# Patient Record
Sex: Female | Born: 1935 | Race: Black or African American | Hispanic: No | Smoking: Current every day smoker
Health system: Southern US, Community
[De-identification: ages and names within clinical notes are randomized; demographics above are authoritative.]

## PROBLEM LIST (undated history)

## (undated) DIAGNOSIS — N289 Disorder of kidney and ureter, unspecified: Secondary | ICD-10-CM

## (undated) DIAGNOSIS — F419 Anxiety disorder, unspecified: Secondary | ICD-10-CM

## (undated) DIAGNOSIS — I10 Essential (primary) hypertension: Secondary | ICD-10-CM

## (undated) HISTORY — PX: ABDOMINAL HYSTERECTOMY: SHX81

---

## 1998-02-21 ENCOUNTER — Ambulatory Visit (HOSPITAL_COMMUNITY): Admission: RE | Admit: 1998-02-21 | Discharge: 1998-02-21 | Payer: Self-pay | Admitting: Neurological Surgery

## 1998-10-31 ENCOUNTER — Ambulatory Visit (HOSPITAL_BASED_OUTPATIENT_CLINIC_OR_DEPARTMENT_OTHER): Admission: RE | Admit: 1998-10-31 | Discharge: 1998-10-31 | Payer: Self-pay | Admitting: Orthopedic Surgery

## 1999-11-05 ENCOUNTER — Encounter: Admission: RE | Admit: 1999-11-05 | Discharge: 1999-11-05 | Payer: Self-pay | Admitting: Family Medicine

## 1999-11-05 ENCOUNTER — Encounter: Payer: Self-pay | Admitting: Family Medicine

## 2002-01-05 ENCOUNTER — Encounter: Payer: Self-pay | Admitting: Family Medicine

## 2002-01-05 ENCOUNTER — Encounter: Admission: RE | Admit: 2002-01-05 | Discharge: 2002-01-05 | Payer: Self-pay | Admitting: Family Medicine

## 2002-04-13 ENCOUNTER — Encounter: Payer: Self-pay | Admitting: Family Medicine

## 2002-04-13 ENCOUNTER — Encounter: Admission: RE | Admit: 2002-04-13 | Discharge: 2002-04-13 | Payer: Self-pay | Admitting: Family Medicine

## 2002-04-14 ENCOUNTER — Encounter: Payer: Self-pay | Admitting: Family Medicine

## 2002-04-14 ENCOUNTER — Encounter: Admission: RE | Admit: 2002-04-14 | Discharge: 2002-04-14 | Payer: Self-pay | Admitting: Family Medicine

## 2002-04-29 ENCOUNTER — Encounter: Admission: RE | Admit: 2002-04-29 | Discharge: 2002-04-29 | Payer: Self-pay | Admitting: Family Medicine

## 2002-04-29 ENCOUNTER — Encounter: Payer: Self-pay | Admitting: Family Medicine

## 2002-06-23 ENCOUNTER — Encounter: Payer: Self-pay | Admitting: Family Medicine

## 2002-06-23 ENCOUNTER — Encounter: Admission: RE | Admit: 2002-06-23 | Discharge: 2002-06-23 | Payer: Self-pay | Admitting: Family Medicine

## 2002-08-01 ENCOUNTER — Encounter: Payer: Self-pay | Admitting: Family Medicine

## 2002-08-01 ENCOUNTER — Encounter: Admission: RE | Admit: 2002-08-01 | Discharge: 2002-08-01 | Payer: Self-pay | Admitting: Family Medicine

## 2002-08-03 ENCOUNTER — Encounter: Payer: Self-pay | Admitting: Family Medicine

## 2002-08-03 ENCOUNTER — Encounter: Admission: RE | Admit: 2002-08-03 | Discharge: 2002-08-03 | Payer: Self-pay | Admitting: Family Medicine

## 2003-10-05 ENCOUNTER — Encounter: Admission: RE | Admit: 2003-10-05 | Discharge: 2003-10-05 | Payer: Self-pay | Admitting: Family Medicine

## 2004-07-03 ENCOUNTER — Encounter: Admission: RE | Admit: 2004-07-03 | Discharge: 2004-07-03 | Payer: Self-pay | Admitting: Family Medicine

## 2004-12-15 ENCOUNTER — Encounter: Admission: RE | Admit: 2004-12-15 | Discharge: 2004-12-15 | Payer: Self-pay | Admitting: Family Medicine

## 2005-02-06 ENCOUNTER — Ambulatory Visit (HOSPITAL_COMMUNITY): Admission: RE | Admit: 2005-02-06 | Discharge: 2005-02-06 | Payer: Self-pay | Admitting: Gastroenterology

## 2005-03-03 ENCOUNTER — Encounter: Admission: RE | Admit: 2005-03-03 | Discharge: 2005-03-03 | Payer: Self-pay | Admitting: Family Medicine

## 2005-09-05 ENCOUNTER — Ambulatory Visit (HOSPITAL_COMMUNITY): Admission: RE | Admit: 2005-09-05 | Discharge: 2005-09-05 | Payer: Self-pay | Admitting: Family Medicine

## 2007-08-04 ENCOUNTER — Encounter: Admission: RE | Admit: 2007-08-04 | Discharge: 2007-08-04 | Payer: Self-pay | Admitting: Family Medicine

## 2008-03-13 ENCOUNTER — Emergency Department (HOSPITAL_COMMUNITY): Admission: EM | Admit: 2008-03-13 | Discharge: 2008-03-13 | Payer: Self-pay | Admitting: Emergency Medicine

## 2008-04-05 ENCOUNTER — Encounter: Admission: RE | Admit: 2008-04-05 | Discharge: 2008-04-05 | Payer: Self-pay | Admitting: Gastroenterology

## 2009-04-08 ENCOUNTER — Inpatient Hospital Stay (HOSPITAL_COMMUNITY): Admission: EM | Admit: 2009-04-08 | Discharge: 2009-04-11 | Payer: Self-pay | Admitting: Emergency Medicine

## 2011-01-27 LAB — BASIC METABOLIC PANEL
BUN: 10 mg/dL (ref 6–23)
CO2: 25 mEq/L (ref 19–32)
CO2: 25 mEq/L (ref 19–32)
CO2: 26 mEq/L (ref 19–32)
Calcium: 8.3 mg/dL — ABNORMAL LOW (ref 8.4–10.5)
Calcium: 8.7 mg/dL (ref 8.4–10.5)
Calcium: 8.7 mg/dL (ref 8.4–10.5)
Chloride: 115 mEq/L — ABNORMAL HIGH (ref 96–112)
Creatinine, Ser: 1.04 mg/dL (ref 0.4–1.2)
GFR calc Af Amer: 58 mL/min — ABNORMAL LOW (ref >60)
GFR calc non Af Amer: 40 mL/min — ABNORMAL LOW (ref >60)
GFR calc non Af Amer: 52 mL/min — ABNORMAL LOW (ref >60)
Glucose, Bld: 110 mg/dL — ABNORMAL HIGH (ref 70–99)
Glucose, Bld: 114 mg/dL — ABNORMAL HIGH (ref 70–99)
Glucose, Bld: 151 mg/dL — ABNORMAL HIGH (ref 70–99)
Potassium: 3.8 mEq/L (ref 3.5–5.1)
Potassium: 3.9 mEq/L (ref 3.5–5.1)
Potassium: 4 mEq/L (ref 3.5–5.1)
Sodium: 142 mEq/L (ref 135–145)

## 2011-01-27 LAB — CBC
HCT: 26.1 % — ABNORMAL LOW (ref 36.0–46.0)
HCT: 30.7 % — ABNORMAL LOW (ref 36.0–46.0)
HCT: 35.2 % — ABNORMAL LOW (ref 36.0–46.0)
Hemoglobin: 10.4 g/dL — ABNORMAL LOW (ref 12.0–15.0)
MCHC: 34.1 g/dL (ref 30.0–36.0)
MCV: 88.8 fL (ref 78.0–100.0)
MCV: 88.8 fL (ref 78.0–100.0)
Platelets: 131 10*3/uL — ABNORMAL LOW (ref 150–400)
Platelets: 138 10*3/uL — ABNORMAL LOW (ref 150–400)
RBC: 2.9 MIL/uL — ABNORMAL LOW (ref 3.87–5.11)
WBC: 9.3 10*3/uL (ref 4.0–10.5)

## 2011-01-27 LAB — URINALYSIS, ROUTINE W REFLEX MICROSCOPIC
Bilirubin Urine: NEGATIVE
Ketones, ur: NEGATIVE mg/dL
Specific Gravity, Urine: 1.013 (ref 1.005–1.030)
Urobilinogen, UA: 1 mg/dL (ref 0.0–1.0)

## 2011-01-27 LAB — ABO/RH: ABO/RH(D): O POS

## 2011-01-27 LAB — DIFFERENTIAL
Basophils Relative: 0 % (ref 0–1)
Eosinophils Relative: 4 % (ref 0–5)
Monocytes Absolute: 0.6 10*3/uL (ref 0.1–1.0)
Monocytes Relative: 9 % (ref 3–12)
Neutrophils Relative %: 49 % (ref 43–77)

## 2011-01-27 LAB — URINE MICROSCOPIC-ADD ON

## 2011-01-27 LAB — PROTIME-INR: INR: 1 (ref 0.00–1.49)

## 2011-01-27 LAB — TYPE AND SCREEN

## 2011-03-04 NOTE — Op Note (Signed)
[name], [name] NO.:  [id number]   MEDICAL RECORD NO.:  [id number]          PATIENT TYPE:  INP   LOCATION:  1602                         FACILITY:  [facility]   PHYSICIAN:  Madlyn Frankel. Charlann Boxer, M.D.  DATE OF BIRTH:  [birth year]   DATE OF PROCEDURE:  04/09/2009  DATE OF DISCHARGE:                               OPERATIVE REPORT   PREOPERATIVE DIAGNOSIS:  Right intertrochanteric femur fracture.   POSTOPERATIVE DIAGNOSIS:  Right intertrochanteric femur fracture.   PROCEDURE:  Open reduction internal fixation, right intertrochanteric  femur fracture utilizing DePuy trochanteric nail size 11 x 200 mm with  130-degree lag screw measuring 110 mm.   SURGEON:  Madlyn Frankel. Charlann Boxer, M.D.   ASSISTANT:  Surgical tech.   ANESTHESIA:  General.   BLOOD LOSS:  Less than 150 mL.   DRAINS:  None.   SPECIMEN:  None.   COMPLICATIONS:  None.   INDICATIONS FOR PROCEDURE:  Ms. Giuffre is an 77 year old female who  resides in [place] with her daughter.  She fell at home and was brought  to the emergency room.  Radiographs revealed that she had a right  intertrochanteric fracture.  We were consulted and she was admitted to  the hospital.  She is relatively healthy.  We stabilized her overnight  with preparation for surgery the next day.  The risks and benefits and  planned procedure were discussed with the family.  Consent was obtained  for the benefit of pain relief and fracture healing.   PROCEDURE IN DETAIL:  The patient was brought to the operative theater.  Once adequate anesthesia and preoperative antibiotics, Ancef, were  administered, the patient was positioned supine and the left leg was  flexed and abducted out of the way.  Then the right leg was placed in a  traction shoe.  Traction and internal rotation was applied and under  fluoroscopic imaging, the fracture was reduced anatomically.   We then did a time-out, identifying the patient, the planned procedure  and  extremity.   The right hip from the crest to the knee was then prepped and draped in  a sterile fashion.   Fluoroscopy was brought into the field and landmarks were identified.  I  made an approximately 4- to 5-cm incision proximal to the trochanter.  Sharp dissection was carried through the gluteal fascia.  A guide pin  was then inserted into the tip of the trochanter and the proximal femur  reamed open.  I then passed by hand the 11 x 200 mm nail into its  correct orientation.  Once it was seated into its correct position, then  a guidewire guide was inserted down through the lateral cortex of the  femur.  Then the guidewire inserted into the femoral head in the AP and  lateral planes and confirmed radiographically.  I then measured the  depth, drilled, tapped and then placed a 110-mm lag screw and allowed  for compression, with medialization of the shaft to the neck  intertrochanteric segment.  Once this was compressed adequately and I  was satisfied with the position, the 36-mm distal interlock was placed  in the static position.   Final radiographs were obtained in the AP and lateral planes.  All  wounds were irrigated.  The distal 2 wounds were reapproximated with 2-0  Vicryl and staples.  On the proximal wound I reapproximated the gluteal  fascia using #1 Vicryl, 2-0 Vicryl in the subcu layer and staples on the  skin.  Her skin was then cleaned, dried and dressed sterilely with 2  Mepilex dressings.  She was brought to the recovery room, extubated, in  stable condition, tolerating this procedure well.      Madlyn Frankel Charlann Boxer, M.D.  Electronically Signed     MDO/MEDQ  D:  04/10/2009  T:  04/10/2009  Job:  [id number]

## 2011-03-04 NOTE — H&P (Signed)
NAMEJOHANNAH, [name] NO.:  [id number]   MEDICAL RECORD NO.:  [id number]          PATIENT TYPE:  INP   LOCATION:  1602                         FACILITY:  [facility]   PHYSICIAN:  Madlyn Frankel. Charlann Boxer, M.D.  DATE OF BIRTH:  1924-12-22   DATE OF ADMISSION:  04/08/2009  DATE OF DISCHARGE:                              HISTORY & PHYSICAL   REASON FOR ADMISSION:  Right intertrochanteric femur fracture.   CHIEF COMPLAINT:  Right hip pain.   HISTORY OF PRESENT ILLNESS:  This was an 77 year old who fell at home  onto her right hip.  She had immediate pain and inability to bear weight  and was brought to the emergency department via ambulance where x-rays  revealed a right intertrochanteric femur fracture.  She was admitted to  the orthopedic service for definitive treatment.  She is a relatively  healthy lady.   PAST MEDICAL HISTORY:  1. Hypertension.  2. Hemorrhoids.   FAMILY HISTORY:  Cancer, diabetes, hypertension.   SOCIAL HISTORY:  She is a smoker.   DRUG ALLERGIES:  CODEINE.   MEDICATIONS:  1. Xanax 0.25 mg p.o. p.r.n.  2. Prilosec 40 mg p.o. daily.  3. Flonase nasal spray p.r.n.   REVIEW OF SYSTEMS:  None other than the HPI.   PHYSICAL EXAMINATION:  Pulse 69, respirations 18, blood pressure 177/77.  GENERAL:  Awake, alert and oriented.  NECK:  Supple.  No carotid bruits.  CHEST:  Lung sounds clear to auscultation bilaterally, but lung sounds  were decreased in the left lower lobe.  BREASTS:  Deferred.  HEART:  S1-S2 distinct.  ABDOMEN:  Soft, bowel sounds present.  GENITOURINARY:  Deferred.  EXTREMITIES:  Right lower extremity in traction.  SKIN:  Had capillary refill positive.  NEUROLOGICAL:  She had intact distal sensibilities right lower  extremity.   LABORATORY DATA:  CBC with differential showed her white blood cells  6.9, hemoglobin 12, hematocrit 35.2, platelets 181.  Metabolic:  Sodium  142, potassium 3.8, BUN 19, creatinine 1.27, glucose 147.   UA was  negative for UTI but did show protein in the urine.   RADIOLOGY:  X-ray right hip showed a right intertrochanteric femur  fracture.   Chest x-ray showed no acute disease.   IMPRESSION:  Right intertrochanteric femur fracture.   PLAN OF ACTION:  Open reduction internal fixation of right femur  fracture with intramedullary nailing by Dr. Durene Romans.  Risks and  complications were discussed.     ______________________________  Yetta Glassman [address], [place]      Madlyn Frankel. Charlann Boxer, M.D.  Electronically Signed    BLM/MEDQ  D:  04/09/2009  T:  04/09/2009  Job:  [id number]

## 2011-03-04 NOTE — Discharge Summary (Signed)
NAMEKATHYE, [name] NO.:  [id number]   MEDICAL RECORD NO.:  [id number]          PATIENT TYPE:  INP   LOCATION:  1602                         FACILITY:  [facility]   PHYSICIAN:  Madlyn Frankel. Charlann Boxer, M.D.  DATE OF BIRTH:  [birth year]   DATE OF ADMISSION:  04/08/2009  DATE OF DISCHARGE:                               DISCHARGE SUMMARY   ADMITTING DIAGNOSES:  1. Osteoporosis with a right femur fracture.  2. Hypertension.  3. Hemorrhoids.   DISCHARGE DIAGNOSES:  1. Osteoporosis, status post right hip open reduction, internal      fixation with trochanteric nail.  2. Hypertension.  3. Hemorrhoids.   HISTORY OF PRESENT ILLNESS:  An 77 year old female fell at home on her  right hip and had immediate pain and inability to bear weight.  X-rays  in the emergency department showed a right intertrochanteric femur  fracture.   CONSULTATION:  None.   PROCEDURE:  Open reduction, internal fixation with intramedullary  nailing through the trochanter by Dr. Durene Romans.   LABORATORY DATA:  CBC final reading; white blood cells 9.3, hematocrit  26.1, platelets 131.  Metabolic; sodium 147, potassium 3.9, BUN 14,  creatinine 1.04, glucose 110.   HOSPITAL COURSE:  The patient admitted to orthopedic service.  Surgery  performed.  She remained hemodynamically orthopedically stable  throughout her course of stay.  Dressing was changed after day one with  no significant drainage from her wound.  She was weightbearing as  tolerated.  She made minimal progress.  Based upon discussion with the  patient and family, she was agreeable to stent placement until further  progress was made.  By April 11, 2009, she was afebrile, stable and ready  for discharge.   DISCHARGE DISPOSITION:  Discharge to skilled nursing facility for rehab  stay in improved condition.   DISCHARGE DIET:  Heart-healthy.   DISCHARGE WOUND CARE:  Keep the wound dry, change the dressing on a  daily basis.   DISCHARGE  MEDICATIONS:  1. Lovenox 40 mg subcu q.24 x10 days.  2. Start enteric-coated aspirin 325 mg 1 p.o. daily x4 weeks after      Lovenox completed.  3. Robaxin 500 mg 1 p.o. q.6 p.r.n. muscle spasm.  4. Norco 5/325 one-to-two p.o. q.4-6 p.r.n. pain.  5. Tylenol 325-650 mg p.o. q.6 p.r.n. pain, substitute this instead of      giving Norco if there is any increased confusion.  6. Colace 100 mg p.o. b.i.d. p.r.n. constipation.  7. MiraLax 17 grams p.o. daily p.r.n. constipation.  8. Xanax 0.25 mg p.o. p.r.n.  9. Pravastatin 40 mg p.o. daily.  10.Flonase nasal spray p.r.n.   DISCHARGE FOLLOWUP:  Follow with Dr. Charlann Boxer at phone number [phone] in 2  weeks for wound check.   DISCHARGE PHYSICAL THERAPY:  She is weightbearing as tolerated with the  use of a rolling walker.  Goals of physical therapy will be to increase  strength, increase balance and encourage independence in activities of  daily living.     ______________________________  [name]. [address], [place]      Madlyn Frankel. Charlann Boxer, M.D.  Electronically  Signed    BLM/MEDQ  D:  04/11/2009  T:  04/11/2009  Job:  [id number]

## 2011-03-07 NOTE — Op Note (Signed)
[name], [name]            ACCOUNT NO.:  [id number]   MEDICAL RECORD NO.:  [id number]          PATIENT TYPE:  AMB   LOCATION:  ENDO                         FACILITY:  [facility]   PHYSICIAN:  Petra Kuba, M.D.    DATE OF BIRTH:  05-31-1925   DATE OF PROCEDURE:  02/06/2005  DATE OF DISCHARGE:                                 OPERATIVE REPORT   PROCEDURE:  Colonoscopy with biopsy.   ENDOSCOPIST:  Petra Kuba, M.D.   ANESTHESIA:  Demerol 50, Versed 3.   INDICATIONS FOR PROCEDURE:  Family history of colon cancer, personal history  of colon polyps, due for repeat screening.  Consent was signed after risks  and benefits, methods, options thoroughly discussed in the office,  __________  with my nurse recently.   DESCRIPTION OF PROCEDURE:  Rectal inspection was pertinent for external  hemorrhoids. Digital exam was negative.  Video pediatric adjustable  colonoscope was inserted and with some difficulty due to a long, looping  colon with rolling her on her back and then rolling her on her right side  and multiple abdominal pressures; we were able to advance to the cecum.  On  insertion, left-sided diverticula were seen. Also, when we got to the cecum,  the linear erythema, probably from trauma and looping was seen in the right  side but no other abnormalities. Cecum was identified by the appendiceal  orifice and ileocecal valve.  Prep was fairly adequate with lots of washing  and suctioning, probably adequate visualization was obtained, but on slow  withdrawal through the colon, no polypoid lesions, masses or other  abnormalities, but the left-sided diverticula were seen.  Anorectal  pull-through on retroflexion confirmed some small hemorrhoids.   Scope was straightened, readvanced a short ways up the left side of the  colon. Air was suctioned, scope removed.  The patient tolerated the  procedure well. There were no obvious immediate complications.   ENDOSCOPIC DIAGNOSES:  1.   Internal/external hemorrhoids.  2.  Tortuous, long looping colon.  3.  Left-sided diverticula, moderate to severe.  4.  Linear erythema on the right side, probably from scope trauma.  5.  Otherwise within normal limits to the cecum.   PLAN:  Happy  to see back p.r.n., return  care to Dr. Manus Gunning  for the  customary healthcare maintenance to include yearly rectals and guaiacs.  Consider a virtual colonoscopy in 5 years if doing well medically and widely  indicated. Question whether colonoscopic screening needs to be done based on  age and other medical problems.      MEM/MEDQ  D:  02/06/2005  T:  02/06/2005  Job:  [id number]   cc:   Bryan Lemma. Manus Gunning, M.D.  [address] [place]  [place] [address]  Fax: [phone]

## 2011-07-16 LAB — URINALYSIS, ROUTINE W REFLEX MICROSCOPIC
Bilirubin Urine: NEGATIVE
Hgb urine dipstick: NEGATIVE
Ketones, ur: NEGATIVE
Nitrite: NEGATIVE
Specific Gravity, Urine: 1.013
pH: 5.5

## 2011-07-16 LAB — URINE MICROSCOPIC-ADD ON

## 2013-11-04 ENCOUNTER — Inpatient Hospital Stay (HOSPITAL_COMMUNITY): Payer: Medicare HMO

## 2013-11-04 ENCOUNTER — Emergency Department (HOSPITAL_COMMUNITY): Payer: Medicare HMO

## 2013-11-04 ENCOUNTER — Encounter (HOSPITAL_COMMUNITY): Payer: Self-pay | Admitting: Emergency Medicine

## 2013-11-04 ENCOUNTER — Inpatient Hospital Stay (HOSPITAL_COMMUNITY)
Admission: EM | Admit: 2013-11-04 | Discharge: 2013-11-20 | DRG: 871 | Disposition: E | Payer: Medicare HMO | Attending: Internal Medicine | Admitting: Internal Medicine

## 2013-11-04 DIAGNOSIS — R4182 Altered mental status, unspecified: Secondary | ICD-10-CM

## 2013-11-04 DIAGNOSIS — S78119A Complete traumatic amputation at level between unspecified hip and knee, initial encounter: Secondary | ICD-10-CM

## 2013-11-04 DIAGNOSIS — A409 Streptococcal sepsis, unspecified: Principal | ICD-10-CM | POA: Diagnosis present

## 2013-11-04 DIAGNOSIS — A491 Streptococcal infection, unspecified site: Secondary | ICD-10-CM

## 2013-11-04 DIAGNOSIS — N189 Chronic kidney disease, unspecified: Secondary | ICD-10-CM | POA: Diagnosis present

## 2013-11-04 DIAGNOSIS — G9341 Metabolic encephalopathy: Secondary | ICD-10-CM | POA: Diagnosis present

## 2013-11-04 DIAGNOSIS — Z515 Encounter for palliative care: Secondary | ICD-10-CM

## 2013-11-04 DIAGNOSIS — I639 Cerebral infarction, unspecified: Secondary | ICD-10-CM

## 2013-11-04 DIAGNOSIS — A419 Sepsis, unspecified organism: Secondary | ICD-10-CM

## 2013-11-04 DIAGNOSIS — E860 Dehydration: Secondary | ICD-10-CM | POA: Diagnosis present

## 2013-11-04 DIAGNOSIS — I6350 Cerebral infarction due to unspecified occlusion or stenosis of unspecified cerebral artery: Secondary | ICD-10-CM | POA: Diagnosis present

## 2013-11-04 DIAGNOSIS — Z66 Do not resuscitate: Secondary | ICD-10-CM | POA: Diagnosis not present

## 2013-11-04 DIAGNOSIS — D649 Anemia, unspecified: Secondary | ICD-10-CM

## 2013-11-04 DIAGNOSIS — D62 Acute posthemorrhagic anemia: Secondary | ICD-10-CM | POA: Diagnosis present

## 2013-11-04 DIAGNOSIS — Z7982 Long term (current) use of aspirin: Secondary | ICD-10-CM

## 2013-11-04 DIAGNOSIS — G9340 Encephalopathy, unspecified: Secondary | ICD-10-CM | POA: Diagnosis present

## 2013-11-04 DIAGNOSIS — I4891 Unspecified atrial fibrillation: Secondary | ICD-10-CM | POA: Diagnosis present

## 2013-11-04 DIAGNOSIS — F17200 Nicotine dependence, unspecified, uncomplicated: Secondary | ICD-10-CM | POA: Diagnosis present

## 2013-11-04 DIAGNOSIS — F0390 Unspecified dementia without behavioral disturbance: Secondary | ICD-10-CM | POA: Diagnosis present

## 2013-11-04 DIAGNOSIS — R197 Diarrhea, unspecified: Secondary | ICD-10-CM | POA: Diagnosis present

## 2013-11-04 DIAGNOSIS — R6520 Severe sepsis without septic shock: Secondary | ICD-10-CM | POA: Diagnosis present

## 2013-11-04 DIAGNOSIS — R7881 Bacteremia: Secondary | ICD-10-CM

## 2013-11-04 DIAGNOSIS — E872 Acidosis, unspecified: Secondary | ICD-10-CM | POA: Diagnosis present

## 2013-11-04 DIAGNOSIS — G039 Meningitis, unspecified: Secondary | ICD-10-CM

## 2013-11-04 DIAGNOSIS — I129 Hypertensive chronic kidney disease with stage 1 through stage 4 chronic kidney disease, or unspecified chronic kidney disease: Secondary | ICD-10-CM | POA: Diagnosis present

## 2013-11-04 DIAGNOSIS — N179 Acute kidney failure, unspecified: Secondary | ICD-10-CM | POA: Diagnosis present

## 2013-11-04 HISTORY — DX: Disorder of kidney and ureter, unspecified: N28.9

## 2013-11-04 HISTORY — DX: Essential (primary) hypertension: I10

## 2013-11-04 HISTORY — DX: Anxiety disorder, unspecified: F41.9

## 2013-11-04 LAB — CBC
HCT: 22 % — ABNORMAL LOW (ref 36.0–46.0)
HEMATOCRIT: 25 % — AB (ref 36.0–46.0)
HEMOGLOBIN: 7.6 g/dL — AB (ref 12.0–15.0)
Hemoglobin: 8.7 g/dL — ABNORMAL LOW (ref 12.0–15.0)
MCH: 30.5 pg (ref 26.0–34.0)
MCH: 30.6 pg (ref 26.0–34.0)
MCHC: 34.5 g/dL (ref 30.0–36.0)
MCHC: 34.8 g/dL (ref 30.0–36.0)
MCV: 88.4 fL (ref 78.0–100.0)
MCV: 88 fL (ref 78.0–100.0)
Platelets: 184 10*3/uL (ref 150–400)
Platelets: 204 10*3/uL (ref 150–400)
RBC: 2.49 MIL/uL — AB (ref 3.87–5.11)
RBC: 2.84 MIL/uL — AB (ref 3.87–5.11)
RDW: 13.5 % (ref 11.5–15.5)
RDW: 13.6 % (ref 11.5–15.5)
WBC: 15.2 10*3/uL — ABNORMAL HIGH (ref 4.0–10.5)
WBC: 16.5 10*3/uL — AB (ref 4.0–10.5)

## 2013-11-04 LAB — COMPREHENSIVE METABOLIC PANEL
ALT: 34 U/L (ref 0–35)
AST: 51 U/L — ABNORMAL HIGH (ref 0–37)
Albumin: 2.4 g/dL — ABNORMAL LOW (ref 3.5–5.2)
Alkaline Phosphatase: 144 U/L — ABNORMAL HIGH (ref 39–117)
BUN: 40 mg/dL — AB (ref 6–23)
CALCIUM: 9.9 mg/dL (ref 8.4–10.5)
CO2: 18 mEq/L — ABNORMAL LOW (ref 19–32)
CREATININE: 2.6 mg/dL — AB (ref 0.50–1.10)
Chloride: 101 mEq/L (ref 96–112)
GFR calc non Af Amer: 16 mL/min — ABNORMAL LOW (ref >90)
GFR, EST AFRICAN AMERICAN: 18 mL/min — AB (ref >90)
GLUCOSE: 136 mg/dL — AB (ref 70–99)
Potassium: 3.9 mEq/L (ref 3.7–5.3)
Sodium: 139 mEq/L (ref 137–147)
TOTAL PROTEIN: 7 g/dL (ref 6.0–8.3)
Total Bilirubin: 0.8 mg/dL (ref 0.3–1.2)

## 2013-11-04 LAB — CBC WITH DIFFERENTIAL/PLATELET
Basophils Absolute: 0 10*3/uL (ref 0.0–0.1)
Basophils Relative: 0 % (ref 0–1)
EOS ABS: 0 10*3/uL (ref 0.0–0.7)
Eosinophils Relative: 0 % (ref 0–5)
HEMATOCRIT: 31.5 % — AB (ref 36.0–46.0)
Hemoglobin: 10.9 g/dL — ABNORMAL LOW (ref 12.0–15.0)
Lymphocytes Relative: 4 % — ABNORMAL LOW (ref 12–46)
Lymphs Abs: 1 10*3/uL (ref 0.7–4.0)
MCH: 30.7 pg (ref 26.0–34.0)
MCHC: 34.6 g/dL (ref 30.0–36.0)
MCV: 88.7 fL (ref 78.0–100.0)
MONO ABS: 0.7 10*3/uL (ref 0.1–1.0)
Monocytes Relative: 3 % (ref 3–12)
NEUTROS ABS: 22.7 10*3/uL — AB (ref 1.7–7.7)
NEUTROS PCT: 93 % — AB (ref 43–77)
Platelets: 265 10*3/uL (ref 150–400)
RBC: 3.55 MIL/uL — ABNORMAL LOW (ref 3.87–5.11)
RDW: 13.4 % (ref 11.5–15.5)
WBC Morphology: INCREASED
WBC: 24.4 10*3/uL — ABNORMAL HIGH (ref 4.0–10.5)

## 2013-11-04 LAB — INFLUENZA PANEL BY PCR (TYPE A & B)
H1N1 flu by pcr: NOT DETECTED
Influenza A By PCR: NEGATIVE
Influenza B By PCR: NEGATIVE

## 2013-11-04 LAB — URINALYSIS, ROUTINE W REFLEX MICROSCOPIC
Bilirubin Urine: NEGATIVE
Glucose, UA: NEGATIVE mg/dL
Hgb urine dipstick: NEGATIVE
Ketones, ur: NEGATIVE mg/dL
LEUKOCYTES UA: NEGATIVE
Nitrite: NEGATIVE
PH: 5 (ref 5.0–8.0)
Protein, ur: 30 mg/dL — AB
SPECIFIC GRAVITY, URINE: 1.019 (ref 1.005–1.030)
UROBILINOGEN UA: 1 mg/dL (ref 0.0–1.0)

## 2013-11-04 LAB — MAGNESIUM: Magnesium: 1.5 mg/dL (ref 1.5–2.5)

## 2013-11-04 LAB — CREATININE, SERUM
CREATININE: 1.96 mg/dL — AB (ref 0.50–1.10)
GFR calc non Af Amer: 22 mL/min — ABNORMAL LOW (ref >90)
GFR, EST AFRICAN AMERICAN: 25 mL/min — AB (ref >90)

## 2013-11-04 LAB — URINE MICROSCOPIC-ADD ON

## 2013-11-04 LAB — MRSA PCR SCREENING: MRSA by PCR: NEGATIVE

## 2013-11-04 LAB — FIBRINOGEN: FIBRINOGEN: 727 mg/dL — AB (ref 204–475)

## 2013-11-04 LAB — CG4 I-STAT (LACTIC ACID): Lactic Acid, Venous: 2.52 mmol/L — ABNORMAL HIGH (ref 0.5–2.2)

## 2013-11-04 LAB — OSMOLALITY: Osmolality: 296 mOsm/kg (ref 275–300)

## 2013-11-04 LAB — AMMONIA: Ammonia: 11 umol/L (ref 11–60)

## 2013-11-04 LAB — PROTIME-INR
INR: 1.28 (ref 0.00–1.49)
PROTHROMBIN TIME: 15.7 s — AB (ref 11.6–15.2)

## 2013-11-04 LAB — APTT: APTT: 34 s (ref 24–37)

## 2013-11-04 MED ORDER — VANCOMYCIN HCL IN DEXTROSE 1-5 GM/200ML-% IV SOLN
1000.0000 mg | Freq: Once | INTRAVENOUS | Status: AC
Start: 2013-11-04 — End: 2013-11-04
  Administered 2013-11-04: 1000 mg via INTRAVENOUS
  Filled 2013-11-04: qty 200

## 2013-11-04 MED ORDER — SODIUM CHLORIDE 0.9 % IV SOLN
25.0000 mg/kg | Freq: Once | INTRAVENOUS | Status: DC
  Filled 2013-11-04: qty 1655

## 2013-11-04 MED ORDER — ACYCLOVIR SODIUM 50 MG/ML IV SOLN
10.0000 mg/kg | INTRAVENOUS | Status: DC
  Administered 2013-11-04: 660 mg via INTRAVENOUS
  Filled 2013-11-04 (×2): qty 13.2

## 2013-11-04 MED ORDER — HEPARIN SODIUM (PORCINE) 5000 UNIT/ML IJ SOLN
5000.0000 [IU] | Freq: Three times a day (TID) | INTRAMUSCULAR | Status: DC
  Filled 2013-11-04 (×2): qty 1

## 2013-11-04 MED ORDER — MORPHINE SULFATE 2 MG/ML IJ SOLN: 2.0000 mg | INTRAMUSCULAR | Status: DC | PRN

## 2013-11-04 MED ORDER — CEFTRIAXONE SODIUM 2 G IJ SOLR: 2.0000 g | Freq: Two times a day (BID) | INTRAMUSCULAR | Status: DC

## 2013-11-04 MED ORDER — SODIUM CHLORIDE 0.9 % IV SOLN: INTRAVENOUS | Status: AC

## 2013-11-04 MED ORDER — ONDANSETRON HCL 4 MG PO TABS: 4.0000 mg | ORAL_TABLET | Freq: Four times a day (QID) | ORAL | Status: DC | PRN

## 2013-11-04 MED ORDER — VANCOMYCIN HCL 1000 MG IV SOLR
15.0000 mg/kg | Freq: Once | INTRAVENOUS | Status: DC
  Filled 2013-11-04: qty 1000

## 2013-11-04 MED ORDER — ONDANSETRON HCL 4 MG/2ML IJ SOLN
4.0000 mg | Freq: Four times a day (QID) | INTRAMUSCULAR | Status: DC | PRN
Start: 2013-11-04 — End: 2013-11-06
  Administered 2013-11-04: 4 mg via INTRAVENOUS
  Filled 2013-11-04: qty 2

## 2013-11-04 MED ORDER — DEXTROSE 5 % IV SOLN
2.0000 g | Freq: Once | INTRAVENOUS | Status: AC
  Administered 2013-11-04: 2 g via INTRAVENOUS
  Filled 2013-11-04: qty 2

## 2013-11-04 MED ORDER — SODIUM CHLORIDE 0.9 % IV SOLN
INTRAVENOUS | Status: DC
Start: 2013-11-04 — End: 2013-11-05
  Administered 2013-11-04 – 2013-11-05 (×3): via INTRAVENOUS

## 2013-11-04 MED ORDER — ONDANSETRON HCL 4 MG/2ML IJ SOLN
4.0000 mg | Freq: Once | INTRAMUSCULAR | Status: AC
  Administered 2013-11-04: 4 mg via INTRAVENOUS
  Filled 2013-11-04: qty 2

## 2013-11-04 MED ORDER — DEXTROSE 5 % IV SOLN
2.0000 g | Freq: Two times a day (BID) | INTRAVENOUS | Status: DC
  Administered 2013-11-05: 2 g via INTRAVENOUS
  Filled 2013-11-04 (×2): qty 2

## 2013-11-04 MED ORDER — VANCOMYCIN HCL IN DEXTROSE 1-5 GM/200ML-% IV SOLN
1000.0000 mg | INTRAVENOUS | Status: DC
Start: 2013-11-06 — End: 2013-11-05

## 2013-11-04 MED ORDER — SODIUM CHLORIDE 0.9 % IV BOLUS (SEPSIS)
1000.0000 mL | Freq: Once | INTRAVENOUS | Status: AC
  Administered 2013-11-04: 1000 mL via INTRAVENOUS

## 2013-11-04 MED ORDER — DEXTROSE 5 % IV SOLN
1.0000 g | INTRAVENOUS | Status: DC
  Administered 2013-11-04: 1 g via INTRAVENOUS
  Filled 2013-11-04: qty 10

## 2013-11-04 MED ORDER — ACETAMINOPHEN 325 MG PO TABS: 650.0000 mg | ORAL_TABLET | ORAL | Status: DC | PRN

## 2013-11-04 MED ORDER — ONDANSETRON HCL 4 MG/2ML IJ SOLN: 4.0000 mg | Freq: Four times a day (QID) | INTRAMUSCULAR | Status: DC | PRN

## 2013-11-04 MED ORDER — ASPIRIN 300 MG RE SUPP
300.0000 mg | Freq: Every day | RECTAL | Status: DC
  Filled 2013-11-04 (×2): qty 1

## 2013-11-04 MED ORDER — ACETAMINOPHEN 325 MG PO TABS: 650.0000 mg | ORAL_TABLET | Freq: Four times a day (QID) | ORAL | Status: DC | PRN

## 2013-11-04 MED ORDER — ACETAMINOPHEN 650 MG RE SUPP: 650.0000 mg | RECTAL | Status: DC | PRN

## 2013-11-04 MED ORDER — ACETAMINOPHEN 650 MG RE SUPP: 650.0000 mg | Freq: Four times a day (QID) | RECTAL | Status: DC | PRN

## 2013-11-04 MED ORDER — SODIUM CHLORIDE 0.9 % IJ SOLN
3.0000 mL | Freq: Two times a day (BID) | INTRAMUSCULAR | Status: DC
  Administered 2013-11-04 – 2013-11-05 (×2): 3 mL via INTRAVENOUS

## 2013-11-04 MED ORDER — ENOXAPARIN SODIUM 30 MG/0.3ML ~~LOC~~ SOLN
30.0000 mg | SUBCUTANEOUS | Status: DC
  Filled 2013-11-04 (×2): qty 0.3

## 2013-11-04 MED ORDER — SODIUM CHLORIDE 0.9 % IV SOLN: 25.0000 mg/kg | Freq: Once | INTRAVENOUS | Status: DC

## 2013-11-04 MED ORDER — ASPIRIN 325 MG PO TABS
325.0000 mg | ORAL_TABLET | Freq: Every day | ORAL | Status: DC
  Filled 2013-11-04 (×2): qty 1

## 2013-11-04 MED ORDER — SODIUM CHLORIDE 0.9 % IV SOLN
2.0000 g | Freq: Four times a day (QID) | INTRAVENOUS | Status: DC
  Administered 2013-11-04 – 2013-11-05 (×3): 2 g via INTRAVENOUS
  Filled 2013-11-04 (×6): qty 2000

## 2013-11-04 MED ORDER — ACETAMINOPHEN 650 MG RE SUPP
650.0000 mg | Freq: Once | RECTAL | Status: AC
  Administered 2013-11-04: 650 mg via RECTAL
  Filled 2013-11-04: qty 1

## 2013-11-04 NOTE — ED Notes (Signed)
RN stated we don't need the CBG per Doctor.

## 2013-11-04 NOTE — Progress Notes (Signed)
ANTIBIOTIC CONSULT NOTE - INITIAL  Pharmacy Consult for Vancomycin, Ceftriaxone, Ampicillin, Acyclovir Indication: rule-out meningitis  Allergies  Allergen Reactions  . Codeine Anaphylaxis    sick    Patient Measurements: Height: 5\' 6"  (167.6 cm) Weight: 146 lb (66.225 kg) IBW/kg (Calculated) : 59.3  Vital Signs: Temp: 100 F (37.8 C) (01/16 1056) Temp src: Rectal (01/16 1056) BP: 141/84 mmHg (01/16 1445) Pulse Rate: 73 (01/16 1445) Intake/Output from previous day:   Intake/Output from this shift:    Labs:  Recent Labs  2013-11-15 1116  WBC 24.4*  HGB 10.9*  PLT 265  CREATININE 2.60*   Estimated Creatinine Clearance: 14.5 ml/min (by C-G formula based on Cr of 2.6). No results found for this basename: VANCOTROUGH, VANCOPEAK, VANCORANDOM, GENTTROUGH, GENTPEAK, GENTRANDOM, TOBRATROUGH, TOBRAPEAK, TOBRARND, AMIKACINPEAK, AMIKACINTROU, AMIKACIN,  in the last 72 hours   Microbiology: No results found for this or any previous visit (from the past 720 hour(s)).  Medical History: Past Medical History  Diagnosis Date  . Hypertension   . Anxiety   . Renal disorder     Medications:  Anti-infectives   Start     Dose/Rate Route Frequency Ordered Stop   11/05/13 1000  cefTRIAXone (ROCEPHIN) 2 g in dextrose 5 % 50 mL IVPB     2 g 100 mL/hr over 30 Minutes Intravenous Every 12 hours 11/15/13 1452     11-15-13 1500  acyclovir (ZOVIRAX) 660 mg in dextrose 5 % 100 mL IVPB     10 mg/kg  66.2 kg 113.2 mL/hr over 60 Minutes Intravenous Every 24 hours November 15, 2013 1449     November 15, 2013 1445  ampicillin (OMNIPEN) 1,655 mg in sodium chloride 0.9 % 50 mL IVPB     25 mg/kg  66.2 kg 150 mL/hr over 20 Minutes Intravenous  Once 11-15-2013 1430     11/15/13 1445  ampicillin (OMNIPEN) 2 g in sodium chloride 0.9 % 50 mL IVPB     2 g 150 mL/hr over 20 Minutes Intravenous 4 times per day 2013-11-15 1430     11-15-13 1415  ampicillin (OMNIPEN) 1,655 mg in sodium chloride 0.9 % 50 mL IVPB  Status:   Discontinued     25 mg/kg  66.2 kg 150 mL/hr over 20 Minutes Intravenous  Once 11/15/13 1413 2013/11/15 1429   11/15/2013 1330  vancomycin (VANCOCIN) IVPB 1000 mg/200 mL premix     1,000 mg 200 mL/hr over 60 Minutes Intravenous  Once 2013-11-15 1315 11/15/2013 1430   2013-11-15 1315  vancomycin (VANCOCIN) 993 mg in sodium chloride 0.9 % 250 mL IVPB  Status:  Discontinued     15 mg/kg  66.2 kg 250 mL/hr over 60 Minutes Intravenous  Once 15-Nov-2013 1304 2013/11/15 1314   11/15/13 1230  cefTRIAXone (ROCEPHIN) 2 g in dextrose 5 % 50 mL IVPB     2 g 100 mL/hr over 30 Minutes Intravenous  Once November 15, 2013 1222 11/15/2013 1429   November 15, 2013 1000  cefTRIAXone (ROCEPHIN) 1 g in dextrose 5 % 50 mL IVPB  Status:  Discontinued     1 g 100 mL/hr over 30 Minutes Intravenous Every 24 hours November 15, 2013 1227 Nov 15, 2013 1452     Assessment: 80 year old female admitted with altered mental status to receive broad spectrum antimicrobial coverage for rule-out meningitis with Vancomycin, Ceftriaxone, Ampicillin, and Acyclovir.  She has renal insufficiency that likely has an acute component.  Her Vancomycin, Ampicillin, and Acyclovir regimens will be adjusted accordingly.  Goal of Therapy:  Vancomycin trough level 15-20 mcg/ml  Plan:  Vancomycin 1gm IV q48h - next dose due 1/18 at 12noon Ceftriaxone 2gm IV q12h Ampicillin 2gm IV q6h Acyclovir 660mg  (10mg /kg) IV q24h Follow renal function closely as adjustments may be required if her renal function changes Follow available microbiologic data  Estella HuskMichelle Ellene Bloodsaw, Pharm.D., BCPS, AAHIVP Clinical Pharmacist Phone: [phone] or [phone] 10/28/2013, 5:33 PM

## 2013-11-04 NOTE — ED Notes (Signed)
Called pharmacy X 2 , spoke with Homero FellersFrank He will be sending it down

## 2013-11-04 NOTE — Progress Notes (Signed)
Unit CM UR Completed by [facility] ED CM  W. Kanan Sobek RN  

## 2013-11-04 NOTE — ED Provider Notes (Signed)
CSN: [id number]     Arrival date & time 11/08/2013  1035 History   First MD Initiated Contact with Patient 11/13/2013 1047     Chief Complaint  Patient presents with  . Fever   (Consider location/radiation/quality/duration/timing/severity/associated sxs/prior Treatment) HPI Comments: 80 year old female presents with her daughters for altered mental status. The patient's been dealing sick for the last week. Started with nausea and diarrhea. This lasted one day and seemed to resolve with Zofran given her PCP. Over the last few days however she started feeling weaker and was having trouble walking due to generalized weakness. She then was noted to have a fever yesterday was complaining of myalgias. Fever was up to 101 yesterday. The history is limited due to patient being altered.   Past Medical History  Diagnosis Date  . Hypertension   . Anxiety   . Renal disorder    Past Surgical History  Procedure Laterality Date  . Abdominal hysterectomy     No family history on file. History  Substance Use Topics  . Smoking status: Current Every Day Smoker    Types: Cigarettes  . Smokeless tobacco: Not on file  . Alcohol Use: No   OB History   Grav Para Term Preterm Abortions TAB SAB Ect Mult Living                 Review of Systems  Unable to perform ROS: Mental status change  Constitutional: Positive for fever.  Psychiatric/Behavioral: Positive for confusion.    Allergies  Codeine  Home Medications   Current Outpatient Rx  Name  Route  Sig  Dispense  Refill  . aspirin 81 MG EC tablet   Oral   Take 81 mg by mouth daily. Swallow whole.         . Cholecalciferol (VITAMIN D3) 1000 UNITS CAPS   Oral   Take 1 capsule by mouth daily.         . cimetidine (TAGAMET) 200 MG tablet   Oral   Take 200 mg by mouth 2 (two) times daily.         . clonazePAM (KLONOPIN) 0.5 MG tablet   Oral   Take 0.25 mg by mouth 2 (two) times daily as needed for anxiety.         Marland Kitchen  HYDROcodone-acetaminophen (NORCO/VICODIN) 5-325 MG per tablet   Oral   Take 1 tablet by mouth daily as needed for moderate pain.         Marland Kitchen losartan-hydrochlorothiazide (HYZAAR) 100-12.5 MG per tablet   Oral   Take 1 tablet by mouth daily.         . methocarbamol (ROBAXIN) 500 MG tablet   Oral   Take 500 mg by mouth 2 (two) times daily.         . Multiple Vitamin (MULTIVITAMIN) tablet   Oral   Take 1 tablet by mouth daily.         Marland Kitchen OVER THE COUNTER MEDICATION   Both Eyes   Place 1 drop into both eyes at bedtime.         . pravastatin (PRAVACHOL) 40 MG tablet   Oral   Take 40 mg by mouth daily.          BP 105/52  Pulse 125  Temp(Src) 100 F (37.8 C) (Rectal)  Resp 32  Ht 5\' 6"  (1.676 m)  Wt 146 lb (66.225 kg)  BMI 23.58 kg/m2  SpO2 98% Physical Exam  Nursing note and vitals reviewed. Constitutional: She  appears well-developed and well-nourished. She appears lethargic.  HENT:  Head: Normocephalic and atraumatic.  Right Ear: External ear normal.  Left Ear: External ear normal.  Nose: Nose normal.  Dry mucous membranes  Eyes: Pupils are equal, round, and reactive to light. Right eye exhibits no discharge. Left eye exhibits no discharge.  Cardiovascular: Regular rhythm and normal heart sounds.  Tachycardia present.   Pulmonary/Chest: Effort normal and breath sounds normal.  Abdominal: Soft. She exhibits no distension. There is no tenderness.  Neurological: She appears lethargic.  Patient's eyes opened the pain but she does not respond with any police. She does respond to painful stimuli. However this time she is unable to follow commands for more complete neurologic testing  Skin: Skin is warm and dry.    ED Course  Procedures (including critical care time) Labs Review Labs Reviewed  CBC WITH DIFFERENTIAL - Abnormal; Notable for the following:    WBC 24.4 (*)    RBC 3.55 (*)    Hemoglobin 10.9 (*)    HCT 31.5 (*)    Neutrophils Relative % 93 (*)     Lymphocytes Relative 4 (*)    Neutro Abs 22.7 (*)    All other components within normal limits  COMPREHENSIVE METABOLIC PANEL - Abnormal; Notable for the following:    CO2 18 (*)    Glucose, Bld 136 (*)    BUN 40 (*)    Creatinine, Ser 2.60 (*)    Albumin 2.4 (*)    AST 51 (*)    Alkaline Phosphatase 144 (*)    GFR calc non Af Amer 16 (*)    GFR calc Af Amer 18 (*)    All other components within normal limits  URINALYSIS, ROUTINE W REFLEX MICROSCOPIC - Abnormal; Notable for the following:    APPearance CLOUDY (*)    Protein, ur 30 (*)    All other components within normal limits  CG4 I-STAT (LACTIC ACID) - Abnormal; Notable for the following:    Lactic Acid, Venous 2.52 (*)    All other components within normal limits  CULTURE, BLOOD (ROUTINE X 2)  CULTURE, BLOOD (ROUTINE X 2)  URINE CULTURE  CSF CULTURE  GRAM STAIN  URINE MICROSCOPIC-ADD ON  CSF CELL COUNT WITH DIFFERENTIAL  CSF CELL COUNT WITH DIFFERENTIAL  GLUCOSE, CSF  PROTEIN, CSF  HERPES SIMPLEX VIRUS(HSV) DNA BY PCR  INFLUENZA PANEL BY PCR (TYPE A & B, H1N1)   Imaging Review Dg Chest Port 1 View  11/17/2013   CLINICAL DATA:  Fever  EXAM: PORTABLE CHEST - 1 VIEW  COMPARISON:  04/08/2009  FINDINGS: The heart size and mediastinal contours are within normal limits. Both lungs are clear. The visualized skeletal structures are unremarkable.  IMPRESSION: No active disease.   Electronically Signed   By: Marlan Palau M.D.   On: 10/23/2013 11:34    EKG Interpretation    Date/Time:  Friday November 04 2013 10:54:47 EST Ventricular Rate:  123 PR Interval:    QRS Duration: 90 QT Interval:  338 QTC Calculation: 483 R Axis:   40 Text Interpretation:  Atrial fibrillation Abnormal R-wave progression, early transition Afib is new from most recent EKG in 2010 Confirmed by Jazzmine Kleiman  MD, Mahasin Riviere ([id number]) on 10/31/2013 12:39:11 PM            MDM   1. Sepsis   2. Altered mental status    Patient lethargic here but  maintaining airway and sats. Tachycardic but no hypotension. Given fever control and  AMS workup not showing an obvious cause. Less likely to be stroke with infectious symptoms and fevers with elevated WBC. Given no obvious PNA or UTI, I recommended LP to family, who agreed after discussing risks/benefits. I attempted multiple times but unable to get fluid. I feel that I was in the space multiple times, likely she had a dry tap with her dehydration. Given this she was already on abx and may need repeat LP when more hydrated. No other obvious source such as abscess, abd pathology or rash/ulcer. Will need stepdown.    Audree CamelScott T Sanford Lindblad, MD 10/21/2013 2219

## 2013-11-04 NOTE — Consult Note (Signed)
PULMONARY / CRITICAL CARE MEDICINE  Name: [name] MRN: [id number] DOB: [birth year]    ADMISSION DATE:  11/08/2013 CONSULTATION DATE:  10/26/2013  REFERRING MD :  [facility] PRIMARY SERVICE:  TRH  CHIEF COMPLAINT:  Acute encephalopathy, concern for ability to protect airway  BRIEF PATIENT DESCRIPTION: 80 yo with dementia brought to ED unresponsive and febrile. Head CT was suspicious for subacute infarct.  SIGNIFICANT EVENTS / STUDIES:  1/16  Head CT >>> Patchy hypodensity in the cerebellum, more so the left, suspicious for subacute infarcts. Alternatively, this might be chronic but is new since 2006. Stable small chronic infarct in the left occipital pole. Generalized cerebral volume loss.  LINE / TUBES:  CULTURES: 1/16  Urine >>> 1/16  Blood >>>  ANTIBIOTICS: Ampicillin 1/16 >>> Ceftriaxone 1/16 >>> Vancomycin 1/16 >>> Acyclovir 1/16 >>>  The patient is encephalopathic and unable to provide history, which was obtained for available medical records.  HISTORY OF PRESENT ILLNESS:  80 yo with dementia brought to ED unresponsive and febrile. Head CT was suspicious for subacute infarct.  PAST MEDICAL HISTORY :  Past Medical History  Diagnosis Date  . Hypertension   . Anxiety   . Renal disorder    Past Surgical History  Procedure Laterality Date  . Abdominal hysterectomy     Prior to Admission medications   Medication Sig Start Date End Date Taking? Authorizing Provider  aspirin 81 MG EC tablet Take 81 mg by mouth daily. Swallow whole.   Yes Historical Provider, MD  Cholecalciferol (VITAMIN D3) 1000 UNITS CAPS Take 1 capsule by mouth daily.   Yes Historical Provider, MD  cimetidine (TAGAMET) 200 MG tablet Take 200 mg by mouth 2 (two) times daily.   Yes Historical Provider, MD  clonazePAM (KLONOPIN) 0.5 MG tablet Take 0.25 mg by mouth 2 (two) times daily as needed for anxiety.   Yes Historical Provider, MD  HYDROcodone-acetaminophen (NORCO/VICODIN) 5-325 MG per tablet Take 1  tablet by mouth daily as needed for moderate pain.   Yes Historical Provider, MD  losartan-hydrochlorothiazide (HYZAAR) 100-12.5 MG per tablet Take 1 tablet by mouth daily.   Yes Historical Provider, MD  methocarbamol (ROBAXIN) 500 MG tablet Take 500 mg by mouth 2 (two) times daily.   Yes Historical Provider, MD  Multiple Vitamin (MULTIVITAMIN) tablet Take 1 tablet by mouth daily.   Yes Historical Provider, MD  OVER THE COUNTER MEDICATION Place 1 drop into both eyes at bedtime.   Yes Historical Provider, MD  pravastatin (PRAVACHOL) 40 MG tablet Take 40 mg by mouth daily.   Yes Historical Provider, MD   Allergies  Allergen Reactions  . Codeine Anaphylaxis    sick    FAMILY HISTORY:  No family history on file.  SOCIAL HISTORY:  reports that she has been smoking Cigarettes.  She has been smoking about 0.00 packs per day. She does not have any smokeless tobacco history on file. She reports that she does not drink alcohol or use illicit drugs.  REVIEW OF SYSTEMS:  Unable to provide.  SUBJECTIVE:   VITAL SIGNS: Temp:  [100 F (37.8 C)] 100 F (37.8 C) (01/16 1056) Pulse Rate:  [42-128] 73 (01/16 1445) Resp:  [16-35] 35 (01/16 1445) BP: (84-141)/(37-84) 141/84 mmHg (01/16 1445) SpO2:  [96 %-100 %] 100 % (01/16 1445) Weight:  [66.225 kg (146 lb)] 66.225 kg (146 lb) (01/16 1056)  PHYSICAL EXAMINATION: General:  No distress Neuro:  GCS3, pupils sluggish, gag / cough diminished HEENT:  Poor dentition Neck:  Neck somewhat rigid Cardiovascular:  RRR, no m/r/g Lungs:  Bilateral air entry, no w/r/r Abdomen:  Soft, nontender, bowel sounds present, no organomegaly Musculoskeletal: No edema Skin:  No rash  CBC  Recent Labs Lab 2014/09/12 1116  WBC 24.4*  HGB 10.9*  HCT 31.5*  PLT 265   Coag's No results found for this basename: APTT, INR,  in the last 168 hours BMET  Recent Labs Lab 2014/09/12 1116  NA 139  K 3.9  CL 101  CO2 18*  BUN 40*  CREATININE 2.60*  GLUCOSE 136*    Electrolytes  Recent Labs Lab 2014/09/12 1116  CALCIUM 9.9   Sepsis Markers  Recent Labs Lab 2014/09/12 1121  LATICACIDVEN 2.52*   ABG No results found for this basename: PHART, PCO2ART, PO2ART,  in the last 168 hours Liver Enzymes  Recent Labs Lab 2014/09/12 1116  AST 51*  ALT 34  ALKPHOS 144*  BILITOT 0.8  ALBUMIN 2.4*   Cardiac Enzymes No results found for this basename: TROPONINI, PROBNP,  in the last 168 hours  Glucose No results found for this basename: GLUCAP,  in the last 168 hours  CXR:  1/16 >>> nad  ASSESSMENT / PLAN:  Acute encephalopathy Subacute CVA Suspected meningitis / encephalitis Baseline dementia Sepsis, source unclear AKA Metabolic acidosis Mild anemia Hyperglycemia  -->  Discussed with family >>> DNR/DNI / no lines and vasopressors, conservative medical management is desired -->  No indications for ICU admission -->  BiPAP contraindicated -->  IV fluids -->  Empirical abx -->  Trend lactate -->  LP by IR pending -->  CBG / SSI -->  Trend labs -->  Neurology / ID consulted  -->  VTE Px -->  PCCM will sign off. Please re consult if necessary  I have personally obtained history, examined patient, evaluated and interpreted laboratory and imaging results, reviewed medical records, formulated assessment / plan and placed orders.  CRITICAL CARE:  The patient is critically ill with multiple organ systems failure and requires high complexity decision making for assessment and support, frequent evaluation and titration of therapies, application of advanced monitoring technologies and extensive interpretation of multiple databases. Critical Care Time devoted to patient care services described in this note is 35 minutes.   Lonia FarberZUBELEVITSKIY, Eusebia Grulke, MD Pulmonary and Critical Care Medicine [facility] Pager: [phone]  02-Sep-2014, 3:48 PM

## 2013-11-04 NOTE — Progress Notes (Signed)
10/29/2013 Pharmacy consulted to dose Rocephin for sepsis.  Pt 80 yo F. Wt 66 Kg.  WBC elevated at 24.4, creat 2.6.  Temp 100. Rocephin does not require dosage adjustment for renal function Plan: 1. Rocephin 2 gm IV x 1 dose now as ordered by MD in ED 2. Then Rocephin 1 gm IV q24 3. Pharmacy will sign-off.  Please re-consult if needed. Thanks Herby AbrahamMichelle T. Lasondra Hodgkins, Pharm.D. [phone] 10/23/2013 12:29 PM

## 2013-11-04 NOTE — H&P (Signed)
History and Physical       Hospital Admission Note Date: 07-12-14  Patient name: [name] Medical record number: [id number] Date of birth: 03/06/1925 Age: 80 y.o. Gender: female  PCP: Thora LanceEHINGER,ROBERT R, MD    Chief Complaint:  Increased lethargy with fevers  HPI: Patient is 80 year old female with history of hypertension, dementia who lives with her daughter was brought to the ER when she was noticed to be very lethargic and unresponsive by her daughter is morning. History was obtained from the patient's daughters present in the room. Patient is extremely lethargic and not able to provide any history and not responding to any verbal commands. Per daughter, patient started having nausea and vomiting on Saturday, 5 days ago and was given Zofran ODT by PCP after which the nausea and vomiting did improve. Patient also had diarrhea which also spontaneously had improved. However, yesterday, she started developing fevers, low grade with nausea, vomiting and diarrhea again. Patient's daughter noticed that she had urinary incontinence, increased generalized weakness, complaining of myalgias. She was not eating much in the last week. This morning, patient was very lethargic and unresponsive. At baseline, patient is normally ambulatory, does have dementia but holds conversation.   ER w/u: Spinal tap was attempted however with no success. BMET showed BUN of 40, creatinine 2.6, albumin 2.4, lactic acid 2.5. CBC showed white count of 24.4 hemoglobin 10.9, neutrophils 93% CT head showed patchy hypodensity in the cerebellum, more so the left suspicious for subacute infarct, ? Chronic but is new since 2006 UA negative for UTI, chest x-ray showed no active disease  Review of Systems:  Unable to obtain from the patient due to her mental status, unresponsive  Past Medical History: Past Medical History  Diagnosis Date  . Hypertension   . Anxiety    . Renal disorder    Past Surgical History  Procedure Laterality Date  . Abdominal hysterectomy      Medications: Prior to Admission medications   Medication Sig Start Date End Date Taking? Authorizing Provider  aspirin 81 MG EC tablet Take 81 mg by mouth daily. Swallow whole.   Yes Historical Provider, MD  Cholecalciferol (VITAMIN D3) 1000 UNITS CAPS Take 1 capsule by mouth daily.   Yes Historical Provider, MD  cimetidine (TAGAMET) 200 MG tablet Take 200 mg by mouth 2 (two) times daily.   Yes Historical Provider, MD  clonazePAM (KLONOPIN) 0.5 MG tablet Take 0.25 mg by mouth 2 (two) times daily as needed for anxiety.   Yes Historical Provider, MD  HYDROcodone-acetaminophen (NORCO/VICODIN) 5-325 MG per tablet Take 1 tablet by mouth daily as needed for moderate pain.   Yes Historical Provider, MD  losartan-hydrochlorothiazide (HYZAAR) 100-12.5 MG per tablet Take 1 tablet by mouth daily.   Yes Historical Provider, MD  methocarbamol (ROBAXIN) 500 MG tablet Take 500 mg by mouth 2 (two) times daily.   Yes Historical Provider, MD  Multiple Vitamin (MULTIVITAMIN) tablet Take 1 tablet by mouth daily.   Yes Historical Provider, MD  OVER THE COUNTER MEDICATION Place 1 drop into both eyes at bedtime.   Yes Historical Provider, MD  pravastatin (PRAVACHOL) 40 MG tablet Take 40 mg by mouth daily.   Yes Historical Provider, MD    Allergies:   Allergies  Allergen Reactions  . Codeine Anaphylaxis    sick    Social History:  reports that she has been smoking Cigarettes.  She has been smoking about 0.00 packs per day. She does not have any smokeless tobacco  history on file. She reports that she does not drink alcohol or use illicit drugs.  Family History: No family history on file.  Physical Exam: Blood pressure 121/80, pulse 90, temperature 100 F (37.8 C), temperature source Rectal, resp. rate 16, height 5\' 6"  (1.676 m), weight 66.225 kg (146 lb), SpO2 100.00%. General: Lethargic, unresponsive,  does not follow any commands HEENT: normocephalic, atraumatic, anicteric sclera, pink conjunctiva, pupils equal and reactive to light and accomodation, oropharynx clear, dry mucous membranes Neck: supple, no masses or lymphadenopathy, no goiter, no bruits  Heart: Regular rate and rhythm, without murmurs, rubs or gallops. Tachycardia Lungs: Clear to auscultation bilaterally anteriorly Abdomen: Soft, nontender, nondistended, positive bowel sounds, no masses. Extremities: No clubbing, cyanosis or edema with positive pedal pulses. Neuro: Does not follow any commands  Psych: Does not follow any commands Skin: no rashes or lesions, warm and dry   LABS on Admission:  Basic Metabolic Panel:  Recent Labs Lab 11/03/2013 1116  NA 139  K 3.9  CL 101  CO2 18*  GLUCOSE 136*  BUN 40*  CREATININE 2.60*  CALCIUM 9.9   Liver Function Tests:  Recent Labs Lab 11/14/2013 1116  AST 51*  ALT 34  ALKPHOS 144*  BILITOT 0.8  PROT 7.0  ALBUMIN 2.4*   No results found for this basename: LIPASE, AMYLASE,  in the last 168 hours No results found for this basename: AMMONIA,  in the last 168 hours CBC:  Recent Labs Lab 11/14/2013 1116  WBC 24.4*  NEUTROABS 22.7*  HGB 10.9*  HCT 31.5*  MCV 88.7  PLT 265   Cardiac Enzymes: No results found for this basename: CKTOTAL, CKMB, CKMBINDEX, TROPONINI,  in the last 168 hours BNP: No components found with this basename: POCBNP,  CBG: No results found for this basename: GLUCAP,  in the last 168 hours   Radiological Exams on Admission: Ct Head Wo Contrast  10/31/2013   CLINICAL DATA:  80 year old female with fever and lethargy. Initial encounter.  EXAM: CT HEAD WITHOUT CONTRAST  TECHNIQUE: Contiguous axial images were obtained from the base of the skull through the vertex without intravenous contrast.  COMPARISON:  Brain MRI 12/15/2004.  FINDINGS: Visualized paranasal sinuses and mastoids are clear. No acute orbit or scalp soft tissue findings. No acute  osseous abnormality identified.  Calcified atherosclerosis at the skull base. Some generalized cerebral volume loss since 2006. No ventriculomegaly. No midline shift, mass effect, or evidence of intracranial mass lesion. Small area of chronic encephalomalacia in the left occipital pole is stable. No acute intracranial hemorrhage identified.  Patchy cerebellar hypodensity, more so on the left (series 2, images 8 and 9). No associated mass effect. No suspicious intracranial vascular hyperdensity. No superimposed acute cortically based infarct identified.  IMPRESSION: 1. Patchy hypodensity in the cerebellum, more so the left, suspicious for subacute infarcts. Alternatively, this might be chronic but is new since 2006. 2. Stable small chronic infarct in the left occipital pole. 3. Generalized cerebral volume loss.   Electronically Signed   By: Augusto Gamble M.D.   On: 10/29/2013 13:14   Dg Chest Port 1 View  11/05/2013   CLINICAL DATA:  Fever  EXAM: PORTABLE CHEST - 1 VIEW  COMPARISON:  04/08/2009  FINDINGS: The heart size and mediastinal contours are within normal limits. Both lungs are clear. The visualized skeletal structures are unremarkable.  IMPRESSION: No active disease.   Electronically Signed   By: Marlan Palau M.D.   On: 11/19/2013 11:34    Assessment/Plan  Principal Problem:   Severe sepsis with acute encephalopathy: Sepsis with unclear source, ? Acute meningitis versus influenza, infectious gastroenteritis, UA negative for UTI chest x-ray does not show any pneumonia -Admit to step down, and aggressive IV fluid hydration, placed on broad-spectrum antibiotics to cover for meningitis including vancomycin, Rocephin, ampicillin IV. Fluoroscopy guided lumbar pincture with studies ordered today. - Obtain blood cultures, urine culture, stool studies, influenza PCR.  - Discussed with ID, Dr. Jerolyn Center, will follow patient for further recommendation. Also consulted PCCM.     Active Problems:    AKI  (acute kidney injury): Likely due to #1/is severe sepsis and acute dehydration, lactic acidosis - Continue aggressive IV fluid hydration  Acute encephalopathy with subacute cerebellar CVA seen on CT imaging - Neurology has been consulted in ED, we'll obtain MRI of the brain for further workup if it is acute CVA.  - Further management or stroke workup as per neurology recommendations. EEG? - Currently n.p.o.    Diarrhea: Rule out any acute infectious colitis/gastroenteritis - Will send out stool studies including C. difficile PCR, GI pathogen panel, O+P,  fecal lactoferrin   DVT prophylaxis:  Lovenox   CODE STATUS:  I discussed in detail with the patient's daughters, at this time they want to pursue Full CODE STATUS. They are open to DNR if patient deteriorates in the next 24-48 hours.    Family Communication: Admission, patients condition and plan of care including tests being ordered have been discussed with the patient's daughters who indicates understanding and agree with the plan and Code Status   Further plan will depend as patient's clinical course evolves and further radiologic and laboratory data become available.   Time Spent on Admission: 1 hour  RAI,RIPUDEEP M.D. [facility] 11/20/13, 2:49 PM Pager: [phone]  If 7PM-7AM, please contact night-coverage [email or web address] Password [email or web address]

## 2013-11-04 NOTE — ED Notes (Signed)
Code Sepsis Level II called.

## 2013-11-04 NOTE — Consult Note (Signed)
[place] for [facility]  Total days of antibiotics 1        Day 1 vanco        Day 1 ceftriaxone               Reason for Consult: sepsis   Referring Physician: rai  Principal Problem:   Severe sepsis Active Problems:   Altered mental status   Encephalopathy acute   AKI (acute kidney injury)   Anemia   Dehydration   Diarrhea    HPI: [name] is a 80 y.o. female with HTN, and mild dementia who was brought to the ED due to being lethargic and unresponsive. She started having fever,N/V/D yesterday but this morning was not responding to her daughter. Her daughter states that 1 week ago she did have illness of N/V which was treated by PCP with zofran.In the ED, she was only responsive to painful stimuli.vitals showed tachycardia of 124, afebrile, bp 84/37. Labs revealed wbc of 24.4K with bands, aki with cr 2.6. LA 2.5. Ua is clean.  Flu screen negative. NCHCT showed possible subacute cerebellum infarct vs. Chronic. cxr no signs of infiltrate. Bedside LP attempted but unsuccesful. Urine and blood cx collected. She was started on ceftriaxone, vancomycin, ampicillin and acyclovir for possible meningitis and transferred to step down unit   Past Medical History  Diagnosis Date  . Hypertension   . Anxiety   . Renal disorder     Allergies:  Allergies  Allergen Reactions  . Codeine Anaphylaxis    sick     MEDICATIONS: . acyclovir  10 mg/kg Intravenous Q24H  . ampicillin (OMNIPEN) IV  25 mg/kg Intravenous Once  . ampicillin (OMNIPEN) IV  2 g Intravenous Q6H  . [START ON 11/05/2013] cefTRIAXone (ROCEPHIN)  IV  2 g Intravenous Q12H    History  Substance Use Topics  . Smoking status: Current Every Day Smoker    Types: Cigarettes  . Smokeless tobacco: Not on file  . Alcohol Use: No    No family history on file.  Review of Systems - Unable to obtain due to being obtunded  OBJECTIVE: Temp:  [100 F (37.8 C)] 100 F (37.8 C) (01/16 1056) Pulse Rate:   [42-128] 73 (01/16 1445) Resp:  [16-35] 35 (01/16 1445) BP: (84-141)/(37-84) 141/84 mmHg (01/16 1445) SpO2:  [96 %-100 %] 100 % (01/16 1445) Weight:  [146 lb (66.225 kg)] 146 lb (66.225 kg) (01/16 1056)  Constitutional: elderly female remains lethargic, unresponsive to voice. appears well-developed and well-nourished. HENT: no nuchal rigidity Mouth/Throat: Oropharynx is clear and dry.. No oropharyngeal exudate.  Cardiovascular: Normal rate, regular rhythm and normal heart sounds. Exam reveals no gallop and no friction rub.  No murmur heard.  Pulmonary/Chest: Effort normal and breath sounds normal. No respiratory distress.  no wheezes.  Abdominal: Soft. Decrease BS. exhibits no distension. There is no tenderness.  Lymphadenopathy: no cervical adenopathy.  Neurological: unable to perform due to unable to follow commands Skin: Skin is warm and dry. No rash noted. No erythema.   LABS: Results for orders placed during the hospital encounter of 11/09/2013 (from the past 48 hour(s))  CBC WITH DIFFERENTIAL     Status: Abnormal   Collection Time    11/09/2013 11:16 AM      Result Value Range   WBC 24.4 (*) 4.0 - 10.5 K/uL   RBC 3.55 (*) 3.87 - 5.11 MIL/uL   Hemoglobin 10.9 (*) 12.0 - 15.0 g/dL   HCT 31.5 (*)  36.0 - 46.0 %   MCV 88.7  78.0 - 100.0 fL   MCH 30.7  26.0 - 34.0 pg   MCHC 34.6  30.0 - 36.0 g/dL   RDW 13.4  11.5 - 15.5 %   Platelets 265  150 - 400 K/uL   Neutrophils Relative % 93 (*) 43 - 77 %   Lymphocytes Relative 4 (*) 12 - 46 %   Monocytes Relative 3  3 - 12 %   Eosinophils Relative 0  0 - 5 %   Basophils Relative 0  0 - 1 %   Neutro Abs 22.7 (*) 1.7 - 7.7 K/uL   Lymphs Abs 1.0  0.7 - 4.0 K/uL   Monocytes Absolute 0.7  0.1 - 1.0 K/uL   Eosinophils Absolute 0.0  0.0 - 0.7 K/uL   Basophils Absolute 0.0  0.0 - 0.1 K/uL   RBC Morphology POLYCHROMASIA PRESENT     WBC Morphology INCREASED BANDS (>20% BANDS)     Comment: MILD LEFT SHIFT (1-5% METAS, OCC MYELO, OCC BANDS)      TOXIC GRANULATION     VACUOLATED NEUTROPHILS  COMPREHENSIVE METABOLIC PANEL     Status: Abnormal   Collection Time    11/09/2013 11:16 AM      Result Value Range   Sodium 139  137 - 147 mEq/L   Potassium 3.9  3.7 - 5.3 mEq/L   Chloride 101  96 - 112 mEq/L   CO2 18 (*) 19 - 32 mEq/L   Glucose, Bld 136 (*) 70 - 99 mg/dL   BUN 40 (*) 6 - 23 mg/dL   Creatinine, Ser 2.60 (*) 0.50 - 1.10 mg/dL   Calcium 9.9  8.4 - 10.5 mg/dL   Total Protein 7.0  6.0 - 8.3 g/dL   Albumin 2.4 (*) 3.5 - 5.2 g/dL   AST 51 (*) 0 - 37 U/L   Comment: HEMOLYSIS AT THIS LEVEL MAY AFFECT RESULT   ALT 34  0 - 35 U/L   Alkaline Phosphatase 144 (*) 39 - 117 U/L   Total Bilirubin 0.8  0.3 - 1.2 mg/dL   GFR calc non Af Amer 16 (*) >90 mL/min   GFR calc Af Amer 18 (*) >90 mL/min   Comment: (NOTE)     The eGFR has been calculated using the CKD EPI equation.     This calculation has not been validated in all clinical situations.     eGFR's persistently <90 mL/min signify possible Chronic Kidney     Disease.  CG4 I-STAT (LACTIC ACID)     Status: Abnormal   Collection Time    11/01/2013 11:21 AM      Result Value Range   Lactic Acid, Venous 2.52 (*) 0.5 - 2.2 mmol/L  URINALYSIS, ROUTINE W REFLEX MICROSCOPIC     Status: Abnormal   Collection Time    11/18/2013 11:57 AM      Result Value Range   Color, Urine YELLOW  YELLOW   APPearance CLOUDY (*) CLEAR   Specific Gravity, Urine 1.019  1.005 - 1.030   pH 5.0  5.0 - 8.0   Glucose, UA NEGATIVE  NEGATIVE mg/dL   Hgb urine dipstick NEGATIVE  NEGATIVE   Bilirubin Urine NEGATIVE  NEGATIVE   Ketones, ur NEGATIVE  NEGATIVE mg/dL   Protein, ur 30 (*) NEGATIVE mg/dL   Urobilinogen, UA 1.0  0.0 - 1.0 mg/dL   Nitrite NEGATIVE  NEGATIVE   Leukocytes, UA NEGATIVE  NEGATIVE  URINE MICROSCOPIC-ADD ON  Status: None   Collection Time    11/02/2013 11:57 AM      Result Value Range   Squamous Epithelial / LPF RARE  RARE   WBC, UA 0-2  <3 WBC/hpf   Bacteria, UA RARE  RARE    Urine-Other AMORPHOUS URATES/PHOSPHATES    INFLUENZA PANEL BY PCR (TYPE A & B, H1N1)     Status: None   Collection Time    11/14/2013  1:30 PM      Result Value Range   Influenza A By PCR NEGATIVE  NEGATIVE   Influenza B By PCR NEGATIVE  NEGATIVE   H1N1 flu by pcr NOT DETECTED  NOT DETECTED   Comment:            The Xpert Flu assay (FDA approved for     nasal aspirates or washes and     nasopharyngeal swab specimens), is     intended as an aid in the diagnosis of     influenza and should not be used as     a sole basis for treatment.    MICRO: 1/16 blood cx pending 1/16 urine cx pending  IMAGING: Ct Head Wo Contrast  10/25/2013   CLINICAL DATA:  80 year old female with fever and lethargy. Initial encounter.  EXAM: CT HEAD WITHOUT CONTRAST  TECHNIQUE: Contiguous axial images were obtained from the base of the skull through the vertex without intravenous contrast.  COMPARISON:  Brain MRI 12/15/2004.  FINDINGS: Visualized paranasal sinuses and mastoids are clear. No acute orbit or scalp soft tissue findings. No acute osseous abnormality identified.  Calcified atherosclerosis at the skull base. Some generalized cerebral volume loss since 2006. No ventriculomegaly. No midline shift, mass effect, or evidence of intracranial mass lesion. Small area of chronic encephalomalacia in the left occipital pole is stable. No acute intracranial hemorrhage identified.  Patchy cerebellar hypodensity, more so on the left (series 2, images 8 and 9). No associated mass effect. No suspicious intracranial vascular hyperdensity. No superimposed acute cortically based infarct identified.  IMPRESSION: 1. Patchy hypodensity in the cerebellum, more so the left, suspicious for subacute infarcts. Alternatively, this might be chronic but is new since 2006. 2. Stable small chronic infarct in the left occipital pole. 3. Generalized cerebral volume loss.   Electronically Signed   By: Lars Pinks M.D.   On: 11/03/2013 13:14   Dg  Chest Port 1 View  10/20/2013   CLINICAL DATA:  Fever  EXAM: PORTABLE CHEST - 1 VIEW  COMPARISON:  04/08/2009  FINDINGS: The heart size and mediastinal contours are within normal limits. Both lungs are clear. The visualized skeletal structures are unremarkable.  IMPRESSION: No active disease.   Electronically Signed   By: Franchot Gallo M.D.   On: 11/01/2013 11:34   Assessment/Plan:  80yo F presents with sepsis of unknown origin, on meningitis antibiotic coverage of vancomycin, ceftriaxone, ampicillin and acyclovir.  - await LP to see if CSF cell count, gram stain appears consistent with CNS infection. If cell count is normal, would discontinue ampicillin and acyclovir - follow cultures to determine how to narrow focus - if she has diarrhea, would send of cdiff and stool culture. If he has diarrhea, would add Iv metronidazole  Dr. Tommy Medal to provide recs on Saturday  Minahil Quinlivan B[facility] [place] [facility] [phone]

## 2013-11-04 NOTE — ED Notes (Signed)
Critical care at bedside  

## 2013-11-04 NOTE — ED Notes (Signed)
Daughters consented to Lumbar puncture.

## 2013-11-04 NOTE — ED Notes (Signed)
Pt.  Began having n/v on Saturday, was given Zofran and pt.s n/v and diarrhea has cleared up Pt. Developed a fever on Thrusdays become increasing lethargic.  She did open her eyes when we changed her . Pt. Is normally ambulatory , goes to the bathroom  In the last few days pt. Has had incontinence. Pt. Is very weak.  Skin is pale warm and dry.

## 2013-11-04 NOTE — Progress Notes (Signed)
11/13/2013 Pharmacy consulted to dose acyclovir and ceftriaxone for r/o meningitis. Wt 66 kg. Creat 2.6, WBC 24.4, temp 100, creat cl ~ 16 ml/min. Plan: 1. Acyclovir 10 mg/kg q24 hr = 660 mg IV q24 2. Ceftriaxone 2 gm IV q12 hr for r/o meningitis, if this is ruled out can decrease dose to 1 gm q24 Herby AbrahamMichelle T. Tandy Lewin, Pharm.D. [phone] 11/09/2013 2:54 PM

## 2013-11-04 NOTE — ED Notes (Signed)
Admitting MD at bedside.

## 2013-11-04 NOTE — ED Notes (Signed)
istat lactic acid shown to [place], R[place]ity fundraiser

## 2013-11-04 NOTE — Consult Note (Addendum)
Referring Physician: Gery Pray    Chief Complaint: stroke  HPI:                                                                                                                                         [name] is an 80 y.o. female female who has mild dementia and was brought to ED due to increased lethargy and unresponsives. Per notes, patient started to have N/V/D on 11/03/13 and then was not responding to her daughter. In the eD she was noted to be tachycardic 124, BP 84/37, afebrile with WBC 24.4. Initial CT head showed patchy hypodensity in the cerebellum suspicious for subacute infarct. Due to Elevated WBC, AMS and clean UA LP was attempted at bedside but was unsuccesful. Patient has been started on Acyclovir, Rocephin, Omnipen and Vancomycin along with ID consultation. Neurology was consulted regarding CT findings.   Currently she is tachycardic with Afib on the monitor. Temp 100, BP 128/76 with pulse of 96. She is only responsive to painful stimuli.    Date last known well: Date: 11/03/2013 Time last known well: Unable to determine tPA Given: No: out of window  Past Medical History  Diagnosis Date  . Hypertension   . Anxiety   . Renal disorder     Past Surgical History  Procedure Laterality Date  . Abdominal hysterectomy      Family history: Unable to obtain due to mental status  Social History:  Per chart she has been smoking Cigarettes.  She has been smoking about 0.00 packs per day. She does not have any smokeless tobacco history on file. She reports that she does not drink alcohol or use illicit drugs.  Allergies:  Allergies  Allergen Reactions  . Codeine Anaphylaxis    sick    Medications:                                                                                                                           Scheduled: . acyclovir  10 mg/kg Intravenous Q24H  . ampicillin (OMNIPEN) IV  25 mg/kg Intravenous Once  . ampicillin (OMNIPEN) IV  2 g Intravenous  Q6H  . [START ON 11/05/2013] cefTRIAXone (ROCEPHIN)  IV  2 g Intravenous Q12H  . heparin  5,000 Units Subcutaneous Q8H  . sodium chloride  3 mL Intravenous Q12H   Continuous: . sodium chloride    .  sodium chloride     ZOX:[id number], acetaminophen, acetaminophen, acetaminophen, morphine injection, ondansetron (ZOFRAN) IV, ondansetron (ZOFRAN) IV, ondansetron  ROS:                                                                                                                                       History obtained from unobtainable from patient due to mental status   Neurologic Examination:                                                                                                      Blood pressure 141/84, pulse 73, temperature 100 F (37.8 C), temperature source Rectal, resp. rate 35, height 5\' 6"  (1.676 m), weight 66.225 kg (146 lb), SpO2 100.00%.  Mental Status:  Does not respond to verbal stimuli, groans when eyes are opened for assessment, localizes to pain with left hand to sternal rub. No verbal output. Does not follow commands Cranial Nerves:  II: Discs flat bilaterally; patient does not blink to threat, pupils equal, round, reactive to light and accommodation  III,IV, VI: dolls intact, when eyelids opened initially disconjugate but then come to midline.  V,VII: right facial droop, decreased corneal on the right VIII: no response to verbal stimuli  IX,X: cough reflex present  XI: unable to test XII: midline tongue  Neck is supple  Motor:  Localizes to pain using the left upper extremity.  Minimal to no movement noted in the right upper extremity.  Both legs extended with increased tone.   Sensory: Responds more to noxious stimuli on the left more than on the right although seems to appreciate bilaterally Deep Tendon Reflexes:  Right:  Upper Extremity   Left:  Upper extremity   biceps (C-5 to C-6) 2/4   biceps (C-5 to C-6) 2/4   tricep (C7) 2/4    triceps (C7) 2/4    Brachioradialis (C6) 2/4   Brachioradialis (C6) 2/4   Lower Extremity    Lower Extremity   quadriceps (L-2 to L-4) 1/4   quadriceps (L-2 to L-4) 1/4   Achilles (S1) 0/4    Achilles (S1) 0/4  Plantars:  Mute bilaterally  Cerebellar:  Unable to assess  Gait: unable to assess.  CV: pulses palpable throughout    Lab Results: Basic Metabolic Panel:  Recent Labs Lab 2013/11/05 1116  NA 139  K 3.9  CL 101  CO2 18*  GLUCOSE 136*  BUN 40*  CREATININE 2.60*  CALCIUM 9.9    Liver Function Tests:  Recent Labs Lab 11/13/2013 1116  AST 51*  ALT 34  ALKPHOS 144*  BILITOT 0.8  PROT 7.0  ALBUMIN 2.4*   No results found for this basename: LIPASE, AMYLASE,  in the last 168 hours No results found for this basename: AMMONIA,  in the last 168 hours  CBC:  Recent Labs Lab 11/01/2013 1116  WBC 24.4*  NEUTROABS 22.7*  HGB 10.9*  HCT 31.5*  MCV 88.7  PLT 265    Cardiac Enzymes: No results found for this basename: CKTOTAL, CKMB, CKMBINDEX, TROPONINI,  in the last 168 hours  Lipid Panel: No results found for this basename: CHOL, TRIG, HDL, CHOLHDL, VLDL, LDLCALC,  in the last 168 hours  CBG: No results found for this basename: GLUCAP,  in the last 168 hours  Microbiology: No results found for this or any previous visit.  Coagulation Studies: No results found for this basename: LABPROT, INR,  in the last 72 hours  Imaging: Ct Head Wo Contrast  10/20/2013   CLINICAL DATA:  80 year old female with fever and lethargy. Initial encounter.  EXAM: CT HEAD WITHOUT CONTRAST  TECHNIQUE: Contiguous axial images were obtained from the base of the skull through the vertex without intravenous contrast.  COMPARISON:  Brain MRI 12/15/2004.  FINDINGS: Visualized paranasal sinuses and mastoids are clear. No acute orbit or scalp soft tissue findings. No acute osseous abnormality identified.  Calcified atherosclerosis at the skull base. Some generalized cerebral volume loss since 2006. No  ventriculomegaly. No midline shift, mass effect, or evidence of intracranial mass lesion. Small area of chronic encephalomalacia in the left occipital pole is stable. No acute intracranial hemorrhage identified.  Patchy cerebellar hypodensity, more so on the left (series 2, images 8 and 9). No associated mass effect. No suspicious intracranial vascular hyperdensity. No superimposed acute cortically based infarct identified.  IMPRESSION: 1. Patchy hypodensity in the cerebellum, more so the left, suspicious for subacute infarcts. Alternatively, this might be chronic but is new since 2006. 2. Stable small chronic infarct in the left occipital pole. 3. Generalized cerebral volume loss.   Electronically Signed   By: Augusto Gamble M.D.   On: 11/15/2013 13:14   Dg Chest Port 1 View  11/11/2013   CLINICAL DATA:  Fever  EXAM: PORTABLE CHEST - 1 VIEW  COMPARISON:  04/08/2009  FINDINGS: The heart size and mediastinal contours are within normal limits. Both lungs are clear. The visualized skeletal structures are unremarkable.  IMPRESSION: No active disease.   Electronically Signed   By: Marlan Palau M.D.   On: 11/15/2013 11:34    Felicie Morn PA-C [facility] [phone]  11/03/2013, 4:57 PM   Patient seen and examined.  Clinical course and management discussed.  Necessary edits performed.  I agree with the above.  Assessment and plan of care developed and discussed below.   Assessment: 80 y.o. female presenting with altered mental status.  Neurologic examination reveals focal findings on the right.  EKG shows atrial fibrillation at 123 bpm.  Patient without history of atrial fibrillation.  Head CT reviewed and shows hypodense area in the left cerebellum but suspect from clinical findings that there are other areas of ischemia.  Further work up recommended.   Patient also with elevated wbc count and low grade fever.  Can not rule out infection or with focal findings a herpes encephalitis or endocarditis.     Stroke Risk Factors - atrial fibrillation and hypertension  Recommendations: 1. HgbA1c, fasting lipid panel 2. MRI, MRA  of the  brain without contrast 3. PT consult, OT consult, Speech consult 4. Echocardiogram 5. Carotid dopplers 6. Prophylactic therapy-Antiplatelet med: Aspirin - dose 300mg  rectally 7. Risk factor modification 8. Telemetry monitoring 9. Frequent neuro checks 10. Agree with broad spectrum antibiotics and acyclovir 11. LP by IR   12. Rate control  Thana FarrLeslie Brittinee Risk, MD [facility] [phone]  10/20/2013  5:16 PM

## 2013-11-05 DIAGNOSIS — A409 Streptococcal sepsis, unspecified: Principal | ICD-10-CM

## 2013-11-05 DIAGNOSIS — I6350 Cerebral infarction due to unspecified occlusion or stenosis of unspecified cerebral artery: Secondary | ICD-10-CM

## 2013-11-05 LAB — BASIC METABOLIC PANEL
BUN: 46 mg/dL — AB (ref 6–23)
CALCIUM: 8.6 mg/dL (ref 8.4–10.5)
CO2: 19 mEq/L (ref 19–32)
Chloride: 108 mEq/L (ref 96–112)
Creatinine, Ser: 2.53 mg/dL — ABNORMAL HIGH (ref 0.50–1.10)
GFR calc Af Amer: 19 mL/min — ABNORMAL LOW (ref >90)
GFR calc non Af Amer: 16 mL/min — ABNORMAL LOW (ref >90)
GLUCOSE: 181 mg/dL — AB (ref 70–99)
Potassium: 3.7 mEq/L (ref 3.7–5.3)
SODIUM: 143 meq/L (ref 137–147)

## 2013-11-05 LAB — CBC
HCT: 27.5 % — ABNORMAL LOW (ref 36.0–46.0)
Hemoglobin: 9.6 g/dL — ABNORMAL LOW (ref 12.0–15.0)
MCH: 30.6 pg (ref 26.0–34.0)
MCHC: 34.9 g/dL (ref 30.0–36.0)
MCV: 87.6 fL (ref 78.0–100.0)
PLATELETS: 229 10*3/uL (ref 150–400)
RBC: 3.14 MIL/uL — ABNORMAL LOW (ref 3.87–5.11)
RDW: 13.9 % (ref 11.5–15.5)
WBC: 22.4 10*3/uL — ABNORMAL HIGH (ref 4.0–10.5)

## 2013-11-05 LAB — URINE CULTURE
COLONY COUNT: NO GROWTH
CULTURE: NO GROWTH

## 2013-11-05 LAB — LIPID PANEL
CHOL/HDL RATIO: 7.8 ratio
Cholesterol: 102 mg/dL (ref 0–200)
HDL: 13 mg/dL — ABNORMAL LOW (ref >39)
LDL CALC: 60 mg/dL (ref 0–99)
Triglycerides: 146 mg/dL (ref <150)
VLDL: 29 mg/dL (ref 0–40)

## 2013-11-05 LAB — LACTIC ACID, PLASMA: Lactic Acid, Venous: 1.9 mmol/L (ref 0.5–2.2)

## 2013-11-05 LAB — TSH: TSH: 1.198 u[IU]/mL (ref 0.350–4.500)

## 2013-11-05 LAB — HEMOGLOBIN A1C
Hgb A1c MFr Bld: 5.7 % — ABNORMAL HIGH (ref <5.7)
Mean Plasma Glucose: 117 mg/dL — ABNORMAL HIGH (ref <117)

## 2013-11-05 MED ORDER — LORAZEPAM 2 MG/ML IJ SOLN
0.5000 mg | INTRAMUSCULAR | Status: DC | PRN
  Administered 2013-11-06 (×2): 1 mg via INTRAVENOUS
  Filled 2013-11-05 (×2): qty 1

## 2013-11-05 MED ORDER — FENTANYL CITRATE 0.05 MG/ML IJ SOLN
25.0000 ug | INTRAMUSCULAR | Status: DC | PRN
  Administered 2013-11-05 – 2013-11-06 (×2): 25 ug via INTRAVENOUS
  Administered 2013-11-06: 50 ug via INTRAVENOUS
  Administered 2013-11-06: 25 ug via INTRAVENOUS
  Filled 2013-11-05: qty 2

## 2013-11-05 MED ORDER — SCOPOLAMINE 1 MG/3DAYS TD PT72
1.0000 | MEDICATED_PATCH | TRANSDERMAL | Status: DC
  Administered 2013-11-05: 1.5 mg via TRANSDERMAL
  Filled 2013-11-05: qty 1

## 2013-11-05 MED ORDER — SODIUM CHLORIDE 0.9 % IV SOLN
10.0000 ug/h | INTRAVENOUS | Status: DC
  Administered 2013-11-05 – 2013-11-06 (×3): 10 ug/h via INTRAVENOUS
  Filled 2013-11-05: qty 50

## 2013-11-05 MED ORDER — SODIUM CHLORIDE 0.9 % IV SOLN
8.0000 mg/h | INTRAVENOUS | Status: DC
  Administered 2013-11-05 – 2013-11-06 (×2): 8 mg/h via INTRAVENOUS
  Filled 2013-11-05 (×6): qty 80

## 2013-11-05 MED ORDER — MORPHINE SULFATE 2 MG/ML IJ SOLN: 1.0000 mg | INTRAMUSCULAR | Status: DC | PRN

## 2013-11-05 NOTE — Progress Notes (Signed)
Pt vomited brownish red emesis with coffee grounds in it. Daphane ShepherdM. Lynch notified  and gave orders. Will continue to monitor pt.

## 2013-11-05 NOTE — Progress Notes (Signed)
PT Cancellation Note  Patient Details Name: [name] MRN: [id number] DOB: 01/27/1925   Cancelled Treatment:    Reason Eval/Treat Not Completed: Patient not medically ready;Medical issues which prohibited therapy. Spoke with RN, PT orders to be D/C. Pt is transitioning to comfort care. If there are any changes please re-order activity as warranted.    Donnamarie [place]ag[place], Evrett Hakim [place], [place] [phone] 11/05/2013, 1:37 PM

## 2013-11-05 NOTE — Progress Notes (Signed)
Stroke Team Progress Note  HISTORY  [name] is an 80 y.o. female female who has mild dementia and was brought to ED due to increased lethargy and unresponsives. Per notes, patient started to have N/V/D on 11/03/13 and then was not responding to her daughter. In the eD she was noted to be tachycardic 124, BP 84/37, afebrile with WBC 24.4. Initial CT head showed patchy hypodensity in the cerebellum suspicious for subacute infarct. Due to Elevated WBC, AMS and clean UA LP was attempted at bedside but was unsuccesful. Patient has been started on Acyclovir, Rocephin, Omnipen and Vancomycin along with ID consultation. Neurology was consulted regarding CT findings.  Currently she is tachycardic with Afib on the monitor. Temp 100, BP 128/76 with pulse of 96. She is only responsive to painful stimuli.   Patient was not a TPA candidate secondary to unknown duration of deficit. She was admitted to the neuro ICU 3E for further evaluation and treatment.  SUBJECTIVE No family is at bedside. The patient will not follow commands, will grimace to pain.  OBJECTIVE Most recent Vital Signs: Filed Vitals:   11/05/13 0400 11/05/13 0412 11/05/13 0750 11/05/13 0752  BP:  123/67 130/66   Pulse:  97 98   Temp: 97.5 F (36.4 C)   97.5 F (36.4 C)  TempSrc: Oral   Oral  Resp:  25 31   Height:      Weight:      SpO2:  100% 100%    CBG (last 3)  No results found for this basename: GLUCAP,  in the last 72 hours  IV Fluid Intake:   . sodium chloride    . sodium chloride 100 mL/hr at Nov 06, 2013 1800    MEDICATIONS  . acyclovir  10 mg/kg Intravenous Q24H  . ampicillin (OMNIPEN) IV  2 g Intravenous Q6H  . aspirin  300 mg Rectal Daily  . aspirin  300 mg Rectal Daily   Or  . aspirin  325 mg Oral Daily  . cefTRIAXone (ROCEPHIN)  IV  2 g Intravenous Q12H  . enoxaparin (LOVENOX) injection  30 mg Subcutaneous Q24H  . sodium chloride  3 mL Intravenous Q12H  . [START ON 11/13/2013] vancomycin  1,000 mg  Intravenous Q48H   PRN:  acetaminophen, acetaminophen, morphine injection, ondansetron (ZOFRAN) IV, ondansetron  Diet:  NPO  liquids Activity:  Bedrest DVT Prophylaxis:  Lovenox  CLINICALLY SIGNIFICANT STUDIES Basic Metabolic Panel:  Recent Labs Lab November 06, 2013 1116 11/10/2013 1834 11/05/13 0540  NA 139  --  143  K 3.9  --  3.7  CL 101  --  108  CO2 18*  --  19  GLUCOSE 136*  --  181*  BUN 40*  --  46*  CREATININE 2.60* 1.96* 2.53*  CALCIUM 9.9  --  8.6  MG  --  1.5  --    Liver Function Tests:  Recent Labs Lab 10/29/2013 1116  AST 51*  ALT 34  ALKPHOS 144*  BILITOT 0.8  PROT 7.0  ALBUMIN 2.4*   CBC:  Recent Labs Lab November 06, 2013 1116  10/21/2013 2144 11/05/13 0540  WBC 24.4*  < > 16.5* 22.4*  NEUTROABS 22.7*  --   --   --   HGB 10.9*  < > 8.7* 9.6*  HCT 31.5*  < > 25.0* 27.5*  MCV 88.7  < > 88.0 87.6  PLT 265  < > 204 229  < > = values in this interval not displayed. Coagulation:  Recent Labs Lab 06-Nov-2013  2144  LABPROT 15.7*  INR 1.28   Cardiac Enzymes: No results found for this basename: CKTOTAL, CKMB, CKMBINDEX, TROPONINI,  in the last 168 hours Urinalysis:  Recent Labs Lab December 04, 2013 1157  COLORURINE YELLOW  LABSPEC 1.019  PHURINE 5.0  GLUCOSEU NEGATIVE  HGBUR NEGATIVE  BILIRUBINUR NEGATIVE  KETONESUR NEGATIVE  PROTEINUR 30*  UROBILINOGEN 1.0  NITRITE NEGATIVE  LEUKOCYTESUR NEGATIVE   Lipid Panel    Component Value Date/Time   CHOL 102 11/05/2013 0540   TRIG 146 11/05/2013 0540   HDL 13* 11/05/2013 0540   CHOLHDL 7.8 11/05/2013 0540   VLDL 29 11/05/2013 0540   LDLCALC 60 11/05/2013 0540   HgbA1C  No results found for this basename: HGBA1C    Urine Drug Screen:   No results found for this basename: labopia, cocainscrnur, labbenz, amphetmu, thcu, labbarb    Alcohol Level: No results found for this basename: ETH,  in the last 168 hours  Ct Head Wo Contrast  12-04-2013   CLINICAL DATA:  80 year old female with fever and lethargy. Initial  encounter.  EXAM: CT HEAD WITHOUT CONTRAST  TECHNIQUE: Contiguous axial images were obtained from the base of the skull through the vertex without intravenous contrast.  COMPARISON:  Brain MRI 12/15/2004.  FINDINGS: Visualized paranasal sinuses and mastoids are clear. No acute orbit or scalp soft tissue findings. No acute osseous abnormality identified.  Calcified atherosclerosis at the skull base. Some generalized cerebral volume loss since 2006. No ventriculomegaly. No midline shift, mass effect, or evidence of intracranial mass lesion. Small area of chronic encephalomalacia in the left occipital pole is stable. No acute intracranial hemorrhage identified.  Patchy cerebellar hypodensity, more so on the left (series 2, images 8 and 9). No associated mass effect. No suspicious intracranial vascular hyperdensity. No superimposed acute cortically based infarct identified.  IMPRESSION: 1. Patchy hypodensity in the cerebellum, more so the left, suspicious for subacute infarcts. Alternatively, this might be chronic but is new since 2006. 2. Stable small chronic infarct in the left occipital pole. 3. Generalized cerebral volume loss.   Electronically Signed   By: Augusto Gamble M.D.   On: Dec 04, 2013 13:14   Dg Chest Port 1 View  December 04, 2013   CLINICAL DATA:  Fever  EXAM: PORTABLE CHEST - 1 VIEW  COMPARISON:  04/08/2009  FINDINGS: The heart size and mediastinal contours are within normal limits. Both lungs are clear. The visualized skeletal structures are unremarkable.  IMPRESSION: No active disease.   Electronically Signed   By: Marlan Palau M.D.   On: 12/04/13 11:34    CT of the brain   IMPRESSION:  1. Patchy hypodensity in the cerebellum, more so the left,  suspicious for subacute infarcts. Alternatively, this might be  chronic but is new since 2006.  2. Stable small chronic infarct in the left occipital pole.  3. Generalized cerebral volume loss.   MRI of the brain    MRA of the brain    2D  Echocardiogram    Carotid Doppler    CXR    EKG   Atrial fibrillation Abnormal R-wave progression, early transition  Therapy Recommendations Pending, currently not appropriate  Physical Exam  General: The patient is not responding well at the time of the examination.   Skin: No significant peripheral edema is noted.   Neurologic Exam  Mental status: The patient is non verbal, will grimace to pain.  Cranial nerves: Facial symmetry is present with grimace. The patient is non-verbal. She will close eyes, not allow  for VF testing.  Motor: The patient symmetric tone on the arms and legs, some spontaneous movement of the left arm. Withdraws some on all 4's to pain.  Sensory examination: Some response to pain on all 4's  Coordination: The patient is unable to cooperate for cerebellar testing.  Gait and station: The gait could not be tested.  Reflexes: Deep tendon reflexes are symmetric.   ASSESSMENT [name] is a 80 y.o. female presenting with altered mental status, signs of sepsis.the patient was on aspirin prior to admission. Patient currently is on aspirin rectal suppository. Patient is being treated with Cipro, vancomycin, and acyclovir. CT the head questions a subacute left cerebellar stroke. The patient is in atrial fibrillation.     atrial fibrillation    altered mental status, sepsis  A left cerebellar stroke  Chronic renal insufficiency  Hospital day # 1  TREATMENT/PLAN  Aspirin for now    possible palliative care consult    depending upon level of aggressiveness of therapy, the patient may obtain MRI of the brain, lumbar puncture  Carotid Doppler  2-D echocardiogram  Will follow mental status  WILLIS,CHARLES KEITH  11/05/2013 9:22 AM

## 2013-11-05 NOTE — Progress Notes (Signed)
[facility]    Subjective: Patient is obtunded   Antibiotics:  Anti-infectives   Start     Dose/Rate Route Frequency Ordered Stop   11/03/2013 1200  vancomycin (VANCOCIN) IVPB 1000 mg/200 mL premix  Status:  Discontinued     1,000 mg 200 mL/hr over 60 Minutes Intravenous Every 48 hours 11-23-2013 1735 11/05/13 0959   11/05/13 1000  cefTRIAXone (ROCEPHIN) 2 g in dextrose 5 % 50 mL IVPB  Status:  Discontinued     2 g 100 mL/hr over 30 Minutes Intravenous Every 12 hours 2013/11/23 1452 November 23, 2013 1735   11/05/13 0200  cefTRIAXone (ROCEPHIN) 2 g in dextrose 5 % 50 mL IVPB  Status:  Discontinued     2 g 100 mL/hr over 30 Minutes Intravenous Every 12 hours 23-Nov-2013 1735 11/05/13 0959   11/23/2013 1500  acyclovir (ZOVIRAX) 660 mg in dextrose 5 % 100 mL IVPB  Status:  Discontinued     10 mg/kg  66.2 kg 113.2 mL/hr over 60 Minutes Intravenous Every 24 hours 11-23-13 1449 11/05/13 0959   11-23-13 1445  ampicillin (OMNIPEN) 1,655 mg in sodium chloride 0.9 % 50 mL IVPB  Status:  Discontinued     25 mg/kg  66.2 kg 150 mL/hr over 20 Minutes Intravenous  Once Nov 23, 2013 1430 11-23-13 1735   2013/11/23 1445  ampicillin (OMNIPEN) 2 g in sodium chloride 0.9 % 50 mL IVPB  Status:  Discontinued     2 g 150 mL/hr over 20 Minutes Intravenous 4 times per day 11/23/13 1430 11/05/13 0959   2013-11-23 1415  ampicillin (OMNIPEN) 1,655 mg in sodium chloride 0.9 % 50 mL IVPB  Status:  Discontinued     25 mg/kg  66.2 kg 150 mL/hr over 20 Minutes Intravenous  Once 11/23/2013 1413 11/23/2013 1429   Nov 23, 2013 1330  vancomycin (VANCOCIN) IVPB 1000 mg/200 mL premix     1,000 mg 200 mL/hr over 60 Minutes Intravenous  Once 2013/11/23 1315 11-23-2013 1430   11/23/2013 1315  vancomycin (VANCOCIN) 993 mg in sodium chloride 0.9 % 250 mL IVPB  Status:  Discontinued     15 mg/kg  66.2 kg 250 mL/hr over 60 Minutes Intravenous  Once 11/23/2013 1304 11-23-2013 1314   11-23-2013 1230  cefTRIAXone (ROCEPHIN) 2 g in dextrose  5 % 50 mL IVPB     2 g 100 mL/hr over 30 Minutes Intravenous  Once November 23, 2013 1222 2013-11-23 1429   2013-11-23 1000  cefTRIAXone (ROCEPHIN) 1 g in dextrose 5 % 50 mL IVPB  Status:  Discontinued     1 g 100 mL/hr over 30 Minutes Intravenous Every 24 hours November 23, 2013 1227 November 23, 2013 1452      Medications: Scheduled Meds: . scopolamine  1 patch Transdermal Q72H   Continuous Infusions: . sodium chloride    . sodium chloride 75 mL/hr at 11/05/13 1040   PRN Meds:.acetaminophen, acetaminophen, fentaNYL, LORazepam, ondansetron (ZOFRAN) IV, ondansetron   Objective: Weight change:   Intake/Output Summary (Last 24 hours) at 11/05/13 1412 Last data filed at 11/05/13 1300  Gross per 24 hour  Intake 1013.2 ml  Output    551 ml  Net  462.2 ml   Blood pressure 123/73, pulse 95, temperature 97.8 F (36.6 C), temperature source Oral, resp. rate 32, height 5\' 7"  (1.702 m), weight 150 lb 5.7 oz (68.2 kg), SpO2 100.00%. Temp:  [97.5 F (36.4 C)-98.4 F (36.9 C)] 97.8 F (36.6 C) (01/17 1204) Pulse Rate:  [73-115] 95 (01/17 1205) Resp:  [15-38]  32 (01/17 1205) BP: (121-141)/(66-84) 123/73 mmHg (01/17 1205) SpO2:  [98 %-100 %] 100 % (01/17 1205) Weight:  [150 lb 5.7 oz (68.2 kg)] 150 lb 5.7 oz (68.2 kg) (01/16 1500)  Physical Exam: General: Obtunded with NG tube in place with dark material being aspirated.Marland Kitchen. HEENT: Eyes closed no trauma CVS tachycardic , normal r,  no murmur rubs or gallops Chest: clear to auscultation bilaterally, no wheezing, rales or rhonchi Abdomen: soft  nondistended, normal bowel sounds, Extremities: no  clubbing or edema noted bilaterally Skin: no rashes  Neuro: nonfocal, she is obtunded and grimaces at times no other purposeful movements on my exam  Lab Results:  Recent Labs  10/23/2013 2144 11/05/13 0540  WBC 16.5* 22.4*  HGB 8.7* 9.6*  HCT 25.0* 27.5*  PLT 204 229    BMET  Recent Labs  11/08/2013 1116 11/03/2013 1834 11/05/13 0540  NA 139  --  143  K 3.9   --  3.7  CL 101  --  108  CO2 18*  --  19  GLUCOSE 136*  --  181*  BUN 40*  --  46*  CREATININE 2.60* 1.96* 2.53*  CALCIUM 9.9  --  8.6    Micro Results: Recent Results (from the past 240 hour(s))  CULTURE, BLOOD (ROUTINE X 2)     Status: None   Collection Time    11/15/2013 11:10 AM      Result Value Range Status   Specimen Description BLOOD LEFT ANTECUBITAL   Final   Special Requests BOTTLES DRAWN AEROBIC AND ANAEROBIC 10CCS   Final   Culture  Setup Time     Final   Value: 11/03/2013 16:02     Performed at [facility]   Culture     Final   Value: GROUP B STREP(S.AGALACTIAE)ISOLATED     16 Note: Gram Stain Report Called to,Read Back By and Verified With: HEATHER RICHARDS RN 1 15 1140P EDMOJ     Performed at [facility]   Report Status PENDING   Incomplete  CULTURE, BLOOD (ROUTINE X 2)     Status: None   Collection Time    10/29/2013 11:15 AM      Result Value Range Status   Specimen Description BLOOD RIGHT FOREARM   Final   Special Requests BOTTLES DRAWN AEROBIC AND ANAEROBIC 10CCS   Final   Culture  Setup Time     Final   Value: 10/28/2013 16:03     Performed at [facility]   Culture     Final   Value: GROUP B STREP(S.AGALACTIAE)ISOLATED     16 Note: Gram Stain Report Called to,Read Back By and Verified With: HEATHER RICHARDS RN 1 15 1140P EDMOJ     Performed at [facility]   Report Status PENDING   Incomplete  MRSA PCR SCREENING     Status: None   Collection Time    11/19/2013  3:54 PM      Result Value Range Status   MRSA by PCR NEGATIVE  NEGATIVE Final   Comment:            The GeneXpert MRSA Assay (FDA     approved for NASAL specimens     only), is one component of a     comprehensive MRSA colonization     surveillance program. It is not     intended to diagnose MRSA     infection nor to guide or     monitor treatment for  MRSA infections.    Studies/Results: Ct Head Wo Contrast  25-Nov-2013   CLINICAL DATA:  80 year old  female with fever and lethargy. Initial encounter.  EXAM: CT HEAD WITHOUT CONTRAST  TECHNIQUE: Contiguous axial images were obtained from the base of the skull through the vertex without intravenous contrast.  COMPARISON:  Brain MRI 12/15/2004.  FINDINGS: Visualized paranasal sinuses and mastoids are clear. No acute orbit or scalp soft tissue findings. No acute osseous abnormality identified.  Calcified atherosclerosis at the skull base. Some generalized cerebral volume loss since 2006. No ventriculomegaly. No midline shift, mass effect, or evidence of intracranial mass lesion. Small area of chronic encephalomalacia in the left occipital pole is stable. No acute intracranial hemorrhage identified.  Patchy cerebellar hypodensity, more so on the left (series 2, images 8 and 9). No associated mass effect. No suspicious intracranial vascular hyperdensity. No superimposed acute cortically based infarct identified.  IMPRESSION: 1. Patchy hypodensity in the cerebellum, more so the left, suspicious for subacute infarcts. Alternatively, this might be chronic but is new since 2006. 2. Stable small chronic infarct in the left occipital pole. 3. Generalized cerebral volume loss.   Electronically Signed   By: Augusto Gamble M.D.   On: 2013/11/25 13:14   Dg Chest Port 1 View  November 25, 2013   CLINICAL DATA:  Fever  EXAM: PORTABLE CHEST - 1 VIEW  COMPARISON:  04/08/2009  FINDINGS: The heart size and mediastinal contours are within normal limits. Both lungs are clear. The visualized skeletal structures are unremarkable.  IMPRESSION: No active disease.   Electronically Signed   By: Marlan Palau M.D.   On: 11-25-2013 11:34      Assessment/Plan: NYEMA HACHEY is a 80 y.o. female with  admission for sepsis of unknown origin broadly covered for bacterial and even viral infection initially. She  is continued to do poorly overnight and decision has been made to pursue pure palliative care. Lumbar punctures not being pursued  antibiotics have been discontinued.   I am fully supportive of pursuing pure palliative care  She is however now  growing Group B streptococci in 2/2 blood cultures now  I would still consider Rocephin 2 grams daily in this pt to rx GBS bacteremia  This would not require aggressive imaging or LP now that we have a pathogen and there is a chance this might allow her better chance to suffer less.  I will inform primary team and see if they and family would like to restart rocephin   I spent greater than 25 minutes with the patient and cousin including greater than 50% of time in face to face counsel     LOS: 1 day   Acey Lav 11/05/2013, 2:12 PM

## 2013-11-05 NOTE — Progress Notes (Signed)
[facility]                                                                              Patient Demographics  [name], is a 80 y.o. female, DOB - 10-30-1924, GMW:[id number]  Admit date - 2013-11-09   Admitting Physician Ripudeep Jenna Luo, MD  Outpatient Primary MD for the patient is Thora Lance, MD  LOS - 1   Chief Complaint  Patient presents with  . 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 -Infectious diseases also consulted -LP was done in the emergency department however was dry -Preliminary blood cultures show gram-positive cocci in chains and clusters, urine culture pending, influenza negative -After discussion with patient's daughter via phone, she wishes to cancel this workup. -At this time patient's family wishes to make her comfort care. Will place patient on morphine as well as Ativan, scopolamine patch, and consult palliative care.  Active Problems Acute kidney injury -Likely secondary to severe sepsis and acute dehydration with lactic acidosis  Lactic acidosis secondary to sepsis -Lactic acid level did improve with IV hydration  Subacute cerebellar CVA on CT imaging -No further workup at this time per patient's family  Diarrhea -No further workup at this time per patient's family  Anemia -Possibly secondary to acute blood loss, overnight patient did require NG tube placement as she was vomiting with emesis -Hemoglobin was noted to be 7.6, repeat was noted to be 9.6 -Blood noted in NG tube as well as container -No further workup secondary to family request -Will d/c lovenox.  Paroxysmal Afib -No anticoagulation.  Code Status: DNR  Family Communication: Daughter, via  phone.  Disposition Plan: Admitted.  Patient made comfort care after conversation with the daughter.  Daughter wishes to have palliative consulted. Will discontinue antibiotics.   Time Spent in minutes   45 minutes  Procedures  None  Consults   Neurology Infectious Disease Palliative Care  DVT Prophylaxis  Lovenox  Lab Results  Component Value Date   PLT 229 11/05/2013    Medications  Scheduled Meds: . acyclovir  10 mg/kg Intravenous Q24H  . ampicillin (OMNIPEN) IV  2 g Intravenous Q6H  . aspirin  300 mg Rectal Daily  . aspirin  300 mg Rectal Daily   Or  . aspirin  325 mg Oral Daily  . cefTRIAXone (ROCEPHIN)  IV  2 g Intravenous Q12H  . enoxaparin (LOVENOX) injection  30 mg Subcutaneous Q24H  . sodium chloride  3 mL Intravenous Q12H  . [START ON 10/27/2013] vancomycin  1,000 mg Intravenous Q48H   Continuous Infusions: . sodium chloride    . sodium chloride 100 mL/hr at 2013/11/09 1800   PRN Meds:.acetaminophen, acetaminophen, morphine injection, ondansetron (ZOFRAN) IV, ondansetron  Antibiotics    Anti-infectives   Start     Dose/Rate Route Frequency Ordered Stop   10/29/2013 1200  vancomycin (VANCOCIN) IVPB 1000 mg/200 mL premix     1,000 mg 200 mL/hr over 60  Minutes Intravenous Every 48 hours 11/28/13 1735     11/05/13 1000  cefTRIAXone (ROCEPHIN) 2 g in dextrose 5 % 50 mL IVPB  Status:  Discontinued     2 g 100 mL/hr over 30 Minutes Intravenous Every 12 hours 11/28/2013 1452 2013/11/28 1735   11/05/13 0200  cefTRIAXone (ROCEPHIN) 2 g in dextrose 5 % 50 mL IVPB     2 g 100 mL/hr over 30 Minutes Intravenous Every 12 hours 11-28-2013 1735     11/28/13 1500  acyclovir (ZOVIRAX) 660 mg in dextrose 5 % 100 mL IVPB     10 mg/kg  66.2 kg 113.2 mL/hr over 60 Minutes Intravenous Every 24 hours 11/28/2013 1449     November 28, 2013 1445  ampicillin (OMNIPEN) 1,655 mg in sodium chloride 0.9 % 50 mL IVPB  Status:  Discontinued     25 mg/kg  66.2 kg 150 mL/hr over 20 Minutes Intravenous   Once Nov 28, 2013 1430 2013-11-28 1735   11-28-2013 1445  ampicillin (OMNIPEN) 2 g in sodium chloride 0.9 % 50 mL IVPB     2 g 150 mL/hr over 20 Minutes Intravenous 4 times per day 11-28-13 1430     11-28-13 1415  ampicillin (OMNIPEN) 1,655 mg in sodium chloride 0.9 % 50 mL IVPB  Status:  Discontinued     25 mg/kg  66.2 kg 150 mL/hr over 20 Minutes Intravenous  Once November 28, 2013 1413 28-Nov-2013 1429   2013-11-28 1330  vancomycin (VANCOCIN) IVPB 1000 mg/200 mL premix     1,000 mg 200 mL/hr over 60 Minutes Intravenous  Once 28-Nov-2013 1315 2013/11/28 1430   11-28-2013 1315  vancomycin (VANCOCIN) 993 mg in sodium chloride 0.9 % 250 mL IVPB  Status:  Discontinued     15 mg/kg  66.2 kg 250 mL/hr over 60 Minutes Intravenous  Once 11/28/13 1304 2013-11-28 1314   11/28/2013 1230  cefTRIAXone (ROCEPHIN) 2 g in dextrose 5 % 50 mL IVPB     2 g 100 mL/hr over 30 Minutes Intravenous  Once 2013-11-28 1222 11/28/13 1429   11/28/13 1000  cefTRIAXone (ROCEPHIN) 1 g in dextrose 5 % 50 mL IVPB  Status:  Discontinued     1 g 100 mL/hr over 30 Minutes Intravenous Every 24 hours Nov 28, 2013 1227 2013-11-28 1452        Subjective:   [name] seen and examined today.    Objective:   Filed Vitals:   11/05/13 0000 11/05/13 0400 11/05/13 0412 11/05/13 0752  BP:   123/67   Pulse:   97   Temp: 98.1 F (36.7 C) 97.5 F (36.4 C)  97.5 F (36.4 C)  TempSrc: Oral Oral  Oral  Resp:   25   Height:      Weight:      SpO2:   100%     Wt Readings from Last 3 Encounters:  11/28/13 68.2 kg (150 lb 5.7 oz)     Intake/Output Summary (Last 24 hours) at 11/05/13 0756 Last data filed at 11/05/13 0200  Gross per 24 hour  Intake  413.2 ml  Output      1 ml  Net  412.2 ml    Exam  General: Well developed, well nourished, NAD, appears stated age  HEENT: NCAT,   Neck: Supple, no JVD, no masses  Cardiovascular: S1 S2 auscultated, Regular rate and rhythm.  Respiratory: Clear to auscultation bilaterally with equal chest  rise  Abdomen: Soft, nontender, nondistended, + bowel sounds  Extremities: warm dry without cyanosis clubbing or edema  Neuro: unable to assess at this time due to current state, withdraws ext to painful stimuli  Skin: Without rashes exudates or nodules  Data Review   Micro Results Recent Results (from the past 240 hour(s))  CULTURE, BLOOD (ROUTINE X 2)     Status: None   Collection Time    11/03/2013 11:10 AM      Result Value Range Status   Specimen Description BLOOD LEFT ANTECUBITAL   Final   Special Requests BOTTLES DRAWN AEROBIC AND ANAEROBIC 10CCS   Final   Culture  Setup Time     Final   Value: 11/11/2013 16:02     Performed at [facility]   Culture     Final   Value: GRAM POSITIVE COCCI IN CHAINS     GRAM POSITIVE COCCI IN CLUSTERS     16 Note: Gram Stain Report Called to,Read Back By and Verified With: HEATHER RICHARDS RN 1 15 1140P EDMOJ     Performed at [facility]   Report Status PENDING   Incomplete  CULTURE, BLOOD (ROUTINE X 2)     Status: None   Collection Time    11/11/2013 11:15 AM      Result Value Range Status   Specimen Description BLOOD RIGHT FOREARM   Final   Special Requests BOTTLES DRAWN AEROBIC AND ANAEROBIC 10CCS   Final   Culture  Setup Time     Final   Value: 11/03/2013 16:03     Performed at [facility]   Culture     Final   Value: GRAM POSITIVE COCCI IN CHAINS     GRAM POSITIVE COCCI IN CLUSTERS     16 Note: Gram Stain Report Called to,Read Back By and Verified With: HEATHER RICHARDS RN 1 15 1140P EDMOJ     Performed at [facility]   Report Status PENDING   Incomplete  MRSA PCR SCREENING     Status: None   Collection Time    11/19/2013  3:54 PM      Result Value Range Status   MRSA by PCR NEGATIVE  NEGATIVE Final   Comment:            The GeneXpert MRSA Assay (FDA     approved for NASAL specimens     only), is one component of a     comprehensive MRSA colonization     surveillance program. It is not      intended to diagnose MRSA     infection nor to guide or     monitor treatment for     MRSA infections.    Radiology Reports Ct Head Wo Contrast  11/05/2013   CLINICAL DATA:  80 year old female with fever and lethargy. Initial encounter.  EXAM: CT HEAD WITHOUT CONTRAST  TECHNIQUE: Contiguous axial images were obtained from the base of the skull through the vertex without intravenous contrast.  COMPARISON:  Brain MRI 12/15/2004.  FINDINGS: Visualized paranasal sinuses and mastoids are clear. No acute orbit or scalp soft tissue findings. No acute osseous abnormality identified.  Calcified atherosclerosis at the skull base. Some generalized cerebral volume loss since 2006. No ventriculomegaly. No midline shift, mass effect, or evidence of intracranial mass lesion. Small area of chronic encephalomalacia in the left occipital pole is stable. No acute intracranial hemorrhage identified.  Patchy cerebellar hypodensity, more so on the left (series 2, images 8 and 9). No associated mass effect. No suspicious intracranial vascular hyperdensity. No superimposed acute cortically based infarct identified.  IMPRESSION: 1. Patchy hypodensity in the cerebellum, more so the left, suspicious for subacute infarcts. Alternatively, this might be chronic but is new since 2006. 2. Stable small chronic infarct in the left occipital pole. 3. Generalized cerebral volume loss.   Electronically Signed   By: Augusto Gamble M.D.   On: November 11, 2013 13:14   Dg Chest Port 1 View  November 11, 2013   CLINICAL DATA:  Fever  EXAM: PORTABLE CHEST - 1 VIEW  COMPARISON:  04/08/2009  FINDINGS: The heart size and mediastinal contours are within normal limits. Both lungs are clear. The visualized skeletal structures are unremarkable.  IMPRESSION: No active disease.   Electronically Signed   By: Marlan Palau M.D.   On: 2013-11-11 11:34    CBC  Recent Labs Lab 11-11-2013 1116 11-Nov-2013 1834 November 11, 2013 2144 11/05/13 0540  WBC 24.4* 15.2* 16.5* 22.4*  HGB  10.9* 7.6* 8.7* 9.6*  HCT 31.5* 22.0* 25.0* 27.5*  PLT 265 184 204 229  MCV 88.7 88.4 88.0 87.6  MCH 30.7 30.5 30.6 30.6  MCHC 34.6 34.5 34.8 34.9  RDW 13.4 13.5 13.6 13.9  LYMPHSABS 1.0  --   --   --   MONOABS 0.7  --   --   --   EOSABS 0.0  --   --   --   BASOSABS 0.0  --   --   --     Chemistries   Recent Labs Lab 11/11/2013 1116 11-11-13 1834 11/05/13 0540  NA 139  --  143  K 3.9  --  3.7  CL 101  --  108  CO2 18*  --  19  GLUCOSE 136*  --  181*  BUN 40*  --  46*  CREATININE 2.60* 1.96* 2.53*  CALCIUM 9.9  --  8.6  MG  --  1.5  --   AST 51*  --   --   ALT 34  --   --   ALKPHOS 144*  --   --   BILITOT 0.8  --   --    ------------------------------------------------------------------------------------------------------------------ estimated creatinine clearance is 15.5 ml/min (by C-G formula based on Cr of 2.53). ------------------------------------------------------------------------------------------------------------------ No results found for this basename: HGBA1C,  in the last 72 hours ------------------------------------------------------------------------------------------------------------------  Recent Labs  11/05/13 0540  CHOL 102  HDL 13*  LDLCALC 60  TRIG 161  CHOLHDL 7.8   ------------------------------------------------------------------------------------------------------------------  Recent Labs  November 11, 2013 1834  TSH 1.198   ------------------------------------------------------------------------------------------------------------------ No results found for this basename: VITAMINB12, FOLATE, FERRITIN, TIBC, IRON, RETICCTPCT,  in the last 72 hours  Coagulation profile  Recent Labs Lab 11-11-2013 2144  INR 1.28    No results found for this basename: DDIMER,  in the last 72 hours  Cardiac Enzymes No results found for this basename: CK, CKMB, TROPONINI, MYOGLOBIN,  in the last 168  hours ------------------------------------------------------------------------------------------------------------------ No components found with this basename: POCBNP,     Michaelina Blandino D.O. on 11/05/2013 at 7:56 AM  Between 7am to 7pm - Pager - [phone]  After 7pm go to [email or web address] - password [email or web address]  And look for the night coverage person covering for me after hours  [facility] Office  [phone]

## 2013-11-05 NOTE — Consult Note (Addendum)
Palliative Medicine Team Consult Note  80 yo with Group B strep bacteremia, new cerebellar CVA, and acute Upper GIB. Family requesting palliative consult.  Met with patient's two daughter and her cousin to discuss comfort care and to solidify goals of care.  Summary of Discussion:  1. DNR 2. Full Comfort-no life prolonging interventions not directly related to comfort 3. Aggressive symptom mangement of pain and dyspnea 4. Provide the environment and care necessary for comfort, preservation of dignity, relief of suffering and allow her to be in her most natural state.  Recommendations:  1. Initiate Fentanyl infusion low dose, bolus for breakthrough pain and dyspnea 2. Place scop patch for nausea and upper airway secretions 3. Ativan prn for agitation 4. Discontinue all meds not directly related to comfort 5. Move to [place], more comfortable for family and will allow [place] to disconnect her cords and monitoring 6. I believe it will be safe at this point to discontinue her NG tube-minimal output over last 4 hours-I have started her on a PPI infusion to minimize her chance for re-bleed as a palliative measure to allow for NG removal- would only recommend leaving G tube unless absolutely necessary. 7. Nasal Cannula at low flow is optional for comfort. 8. Chaplain consult and comfort cart for family.  PPS: 10%  Prognosis: Hours-days  Consider GIP hospice referral tomorrow with [facility] who will do weekend eval/admission. PMT happy to assume attending if approved by the hospice provider for GIP status inpatient.  50 minutes. Greater than 50%  of this time was spent counseling and coordinating care related to the above assessment and plan.  Lane Hacker, DO Palliative Medicine

## 2013-11-06 ENCOUNTER — Encounter (HOSPITAL_COMMUNITY): Payer: Self-pay | Admitting: *Deleted

## 2013-11-06 DIAGNOSIS — B951 Streptococcus, group B, as the cause of diseases classified elsewhere: Secondary | ICD-10-CM

## 2013-11-06 DIAGNOSIS — R7881 Bacteremia: Secondary | ICD-10-CM

## 2013-11-06 DIAGNOSIS — Z515 Encounter for palliative care: Secondary | ICD-10-CM

## 2013-11-06 LAB — CULTURE, BLOOD (ROUTINE X 2)

## 2013-11-07 NOTE — Progress Notes (Signed)
Spoke to patient's daughter Albin FellingCarla who verified that they did not want an autopsy done. Relayed this message to Colan Neptuneimothy McNeal, medical examiner.  Kathlene NovemberEckelmann, Melaysia Streed [place] Van LearEileen

## 2013-11-20 NOTE — Progress Notes (Signed)
Fentanyl citrate drip 165 cc wasted to sink as witnessed by Peri MarisAndrew Brake R.N.

## 2013-11-20 NOTE — Progress Notes (Signed)
Due to comfort care measures, stroke team will sign off, the patient was not seen today.

## 2013-11-20 NOTE — Progress Notes (Signed)
Patient's daughters(Hosni and cassnadra) left the room,nurse asked them for immediate contact number in case we needed to call them.Cassandra asked who will be the nurse tonight,this nurse assured them that we  Are going to call them if the time for their mom is nearing then leff going home to rest.

## 2013-11-20 NOTE — Progress Notes (Signed)
Called [facility] ,referral no. [phone],per Elisabeth MostBrent Morin,patient is not suitable to any form of organs /eyes donations secondary to her old age,may release the body to any funeral choice by the family.

## 2013-11-20 NOTE — Progress Notes (Signed)
Went into the patient's room to check her,noticed a paled looking skin on her face,no signs of pain,facial muscles are relax and a very shallow breathing,she still has warm body,as i looking and observing her,she took one deep breathing and as i observed,no follow -up breathing.Called for another nurse for second assesemment,Wendy Guthrie ,came,no signs of vitals,pupils is fixed,no signs of life.Time of expiration-1719.M.D. Made aware.

## 2013-11-20 NOTE — Discharge Summary (Signed)
Death Summary  [name] ZOX:[id number] DOB: Dec 23, 1924 DOA: 11-27-2013  PCP: Thora Lance, MD PCP/Office notified:   Admit date: 2013-11-27 Date of Death: 11-29-2013  Final Diagnoses:  Principal Problem:   Severe sepsis Active Problems:   Altered mental status   Encephalopathy acute   AKI (acute kidney injury)   Anemia   Dehydration   Diarrhea   Sepsis(995.91)   Meningitis   CVA (cerebral infarction)   History of present illness:  Patient is 80 year old female with history of hypertension, dementia who lives with her daughter was brought to the ER when she was noticed to be very lethargic and unresponsive by her daughter is morning. History was obtained from the patient's daughters present in the room. Patient is extremely lethargic and not able to provide any history and not responding to any verbal commands. Per daughter, patient started having nausea and vomiting on Saturday, 5 days ago and was given Zofran ODT by PCP after which the nausea and vomiting did improve. Patient also had diarrhea which also spontaneously had improved. However, yesterday, she started developing fevers, low grade with nausea, vomiting and diarrhea again. Patient's daughter noticed that she had urinary incontinence, increased generalized weakness, complaining of myalgias. She was not eating much in the last week. This morning, patient was very lethargic and unresponsive. At baseline, patient is normally ambulatory, does have dementia but holds conversation.  ER w/u: Spinal tap was attempted however with no success. BMET showed BUN of 40, creatinine 2.6, albumin 2.4, lactic acid 2.5. CBC showed white count of 24.4 hemoglobin 10.9, neutrophils 93%  CT head showed patchy hypodensity in the cerebellum, more so the left suspicious for subacute infarct, ? Chronic but is new since 2006  UA negative for UTI, chest x-ray showed no active disease   Hospital Course:  Patient was admitted to step down unit.   Neurology, Infectious disease were consulted.  Patient was also seen by PCCM in the ER and was made DNR/DNI.  Patient was found to have subacute infarcts on CT as well as new found weakness on the right during exam.  Family did not wish to pursue stroke workup.  She was also found to have septicemia with Group B strep.  Patient also developed bloody emesis in step down and NG tube was placed.  This information was conveyed to the family and patient was made comfortable.  Palliative care was consulted.  Patient was placed on fentanyl infusion and ativan.    Principal Problem:  Severe sepsis with Acute encephalopathy  -Possibly multifactorial including CVA versus infectious  -Subacute cerebellar CVA seen on CT imaging  -Neurology was consulted, MRI and MRA of the brain were ordered along with echocardiogram and carotid Doppler.  -Infectious diseases also consulted  -LP was done in the emergency department however was dry  -Blood cultures positive for group B strep, urine culture pending, influenza negative  -After discussion with patient's daughter via phone, she wishes to cancel this workup.  -palliative care consulted as request of family and patient was placed on fentanyl infusion, ativan PRN, scopolamine patch  -Patient expired at 79 with family at bedside  Active Problems  Acute kidney injury  -Likely secondary to severe sepsis and acute dehydration with lactic acidosis   Lactic acidosis secondary to sepsis  -Lactic acid level did improve with IV hydration   Subacute cerebellar CVA on CT imaging  -No further workup at this time per patient's family   Diarrhea  -No further workup at this time  per patient's family   Anemia  -Possibly secondary to acute blood loss, overnight patient did require NG tube placement as she was vomiting with emesis  -Hemoglobin was noted to be 7.6, repeat was noted to be 9.6  -NG tube removed for comfort  -No further workup secondary to family request    Paroxysmal Afib  -No anticoagulation.     Time: 15 minutes  Signed:  Edsel PetrinMIKHAIL, Sanaz Scarlett  [facility] 12/07/2013, 8:07 PM

## 2013-11-20 NOTE — Progress Notes (Signed)
Patient's daughter consented to bring down the patient to [email or web address] funeral home of choice is [email or web address] with tel no [phone]

## 2013-11-20 NOTE — Progress Notes (Addendum)
[facility]                                                                              Patient Demographics  [name], is a 80 y.o. female, DOB - 06-28-1925, ZOX:[id number]  Admit date - 2013/11/29   Admitting Physician Ripudeep Jenna Luo, MD  Outpatient Primary MD for the patient is Thora Lance, MD  LOS - 2   Chief Complaint  Patient presents with  . 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 -Infectious diseases also consulted -LP was done in the emergency department however was dry -Blood cultures positive for group B strep, urine culture pending, influenza negative -After discussion with patient's daughter via phone, she wishes to cancel this workup. -palliative care consulted as request of family -Continue fentanyl infusion, ativan PRN, scopolamine patch  Active Problems Acute kidney injury -Likely secondary to severe sepsis and acute dehydration with lactic acidosis  Lactic acidosis secondary to sepsis -Lactic acid level did improve with IV hydration  Subacute cerebellar CVA on CT imaging -No further workup at this time per patient's family  Diarrhea -No further workup at this time per patient's family  Anemia -Possibly secondary to acute blood loss, overnight patient did require NG tube placement as she was vomiting with emesis -Hemoglobin was noted to be 7.6, repeat was noted to be 9.6 -NG tube removed for comfort -No further workup secondary to family request  Paroxysmal Afib -No anticoagulation.  Code Status: DNR  Family Communication: Daughter, via phone.  Disposition Plan: Admitted.  Patient made comfort care after conversation with the daughter 11/05/13.  Palliative care  consulted.  May need hospice consult on 11/07/13.  Time Spent in minutes   25 minutes  Procedures  None  Consults   Neurology Infectious Disease Palliative Care  DVT Prophylaxis  Lovenox  Lab Results  Component Value Date   PLT 229 11/05/2013    Medications  Scheduled Meds: . scopolamine  1 patch Transdermal Q72H   Continuous Infusions: . fentaNYL infusion INTRAVENOUS 10 mcg/hr (10/26/2013 0745)  . pantoprozole (PROTONIX) infusion 8 mg/hr (11/03/2013 0351)   PRN Meds:.acetaminophen, fentaNYL, LORazepam, ondansetron (ZOFRAN) IV  Antibiotics    Anti-infectives   Start     Dose/Rate Route Frequency Ordered Stop   11/08/2013 1200  vancomycin (VANCOCIN) IVPB 1000 mg/200 mL premix  Status:  Discontinued     1,000 mg 200 mL/hr over 60 Minutes Intravenous Every 48 hours 11/29/2013 1735 11/05/13 0959   11/05/13 1000  cefTRIAXone (ROCEPHIN) 2 g in dextrose 5 % 50 mL IVPB  Status:  Discontinued     2 g 100 mL/hr over 30 Minutes Intravenous Every 12 hours 2013/11/29 1452 November 29, 2013 1735   11/05/13 0200  cefTRIAXone (ROCEPHIN) 2 g in dextrose 5 % 50 mL IVPB  Status:  Discontinued     2 g 100 mL/hr over 30 Minutes Intravenous Every 12 hours 11-29-2013 1735 11/05/13 0959   11-29-13 1500  acyclovir (ZOVIRAX)  660 mg in dextrose 5 % 100 mL IVPB  Status:  Discontinued     10 mg/kg  66.2 kg 113.2 mL/hr over 60 Minutes Intravenous Every 24 hours November 26, 2013 1449 11/05/13 0959   2013/11/26 1445  ampicillin (OMNIPEN) 1,655 mg in sodium chloride 0.9 % 50 mL IVPB  Status:  Discontinued     25 mg/kg  66.2 kg 150 mL/hr over 20 Minutes Intravenous  Once 11/26/13 1430 November 26, 2013 1735   11/26/2013 1445  ampicillin (OMNIPEN) 2 g in sodium chloride 0.9 % 50 mL IVPB  Status:  Discontinued     2 g 150 mL/hr over 20 Minutes Intravenous 4 times per day 11/26/13 1430 11/05/13 0959   November 26, 2013 1415  ampicillin (OMNIPEN) 1,655 mg in sodium chloride 0.9 % 50 mL IVPB  Status:  Discontinued     25 mg/kg  66.2 kg 150 mL/hr over  20 Minutes Intravenous  Once 11/26/2013 1413 Nov 26, 2013 1429   11/26/2013 1330  vancomycin (VANCOCIN) IVPB 1000 mg/200 mL premix     1,000 mg 200 mL/hr over 60 Minutes Intravenous  Once 2013-11-26 1315 2013/11/26 1430   11/26/13 1315  vancomycin (VANCOCIN) 993 mg in sodium chloride 0.9 % 250 mL IVPB  Status:  Discontinued     15 mg/kg  66.2 kg 250 mL/hr over 60 Minutes Intravenous  Once 2013-11-26 1304 11-26-13 1314   2013/11/26 1230  cefTRIAXone (ROCEPHIN) 2 g in dextrose 5 % 50 mL IVPB     2 g 100 mL/hr over 30 Minutes Intravenous  Once 2013/11/26 1222 11/26/2013 1429   26-Nov-2013 1000  cefTRIAXone (ROCEPHIN) 1 g in dextrose 5 % 50 mL IVPB  Status:  Discontinued     1 g 100 mL/hr over 30 Minutes Intravenous Every 24 hours 2013-11-26 1227 11-26-2013 1452        Subjective:   [name] seen and examined today.    Objective:   Filed Vitals:   11/05/13 1205 11/05/13 1605 11/05/13 2006 11/16/2013 0515  BP: 123/73 116/58 123/64 109/84  Pulse: 95 113 107 99  Temp:  98.8 F (37.1 C) 99.1 F (37.3 C) 98.2 F (36.8 C)  TempSrc:  Axillary Axillary Oral  Resp: 32 27 37 33  Height:      Weight:      SpO2: 100% 100% 94% 98%    Wt Readings from Last 3 Encounters:  11-26-13 68.2 kg (150 lb 5.7 oz)     Intake/Output Summary (Last 24 hours) at 11/16/2013 1610 Last data filed at 10/31/2013 9604  Gross per 24 hour  Intake 583.87 ml  Output    955 ml  Net -371.13 ml    Exam  General: Well developed, well nourished, NAD, appears stated age  HEENT: NCAT,   Neck: Supple, no JVD, no masses  Cardiovascular: S1 S2 auscultated, tachycardic  Respiratory: Clear to auscultation bilaterally with equal chest rise  Abdomen: Soft, nontender, nondistended, + bowel sounds  Extremities: warm dry without cyanosis clubbing or edema  Neuro: unable to assess at this time due to current state  Skin: Without rashes exudates or nodules  Data Review   Micro Results Recent Results (from the past 240 hour(s))   CULTURE, BLOOD (ROUTINE X 2)     Status: None   Collection Time    11/26/2013 11:10 AM      Result Value Range Status   Specimen Description BLOOD LEFT ANTECUBITAL   Final   Special Requests BOTTLES DRAWN AEROBIC AND ANAEROBIC 10CCS   Final  Culture  Setup Time     Final   Value: September 03, 2014 16:02     [facility]formed at [facility]   Culture     Final   Value: GROUP B STREP(S.AGALACTIAE)ISOLATED     16 Note: Gram Stain Report Called to,Read Back By and Verified With: HEATHER RICHARDS RN 1 15 1140P EDMOJ     Performed at [facility]   Report Status 11/12/2013 FINAL   Final   Organism ID, Bacteria GROUP B STREP(S.AGALACTIAE)ISOLATED   Final  CULTURE, BLOOD (ROUTINE X 2)     Status: None   Collection Time    October 03, 2014 11:15 AM      Result Value Range Status   Specimen Description BLOOD RIGHT FOREARM   Final   Special Requests BOTTLES DRAWN AEROBIC AND ANAEROBIC 10CCS   Final   Culture  Setup Time     Final   Value: September 03, 2014 16:03     Performed at [facility]   Culture     Final   Value: GROUP B STREP(S.AGALACTIAE)ISOLATED     Note: SUSCEPTIBILITIES PERFORMED ON PREVIOUS CULTURE WITHIN THE LAST 5 DAYS.     16 Note: Gram Stain Report Called to,Read Back By and Verified With: HEATHER RICHARDS RN 1 15 1140P EDMOJ     Performed at [facility]   Report Status 11/12/2013 FINAL   Final  URINE CULTURE     Status: None   Collection Time    October 03, 2014 11:57 AM      Result Value Range Status   Specimen Description URINE, CATHETERIZED   Final   Special Requests NONE   Final   Culture  Setup Time     Final   Value: September 03, 2014 17:10     Performed at [facility]   Colony Count     Final   Value: NO GROWTH     Performed at [facility]   Culture     Final   Value: NO GROWTH     Performed at [facility]   Report Status 11/05/2013 FINAL   Final  MRSA PCR SCREENING     Status: None   Collection Time    October 03, 2014  3:54 PM      Result  Value Range Status   MRSA by PCR NEGATIVE  NEGATIVE Final   Comment:            The GeneXpert MRSA Assay (FDA     approved for NASAL specimens     only), is one component of a     comprehensive MRSA colonization     surveillance program. It is not     intended to diagnose MRSA     infection nor to guide or     monitor treatment for     MRSA infections.    Radiology Reports Ct Head Wo Contrast  11-11-2013   CLINICAL DATA:  80 year old female with fever and lethargy. Initial encounter.  EXAM: CT HEAD WITHOUT CONTRAST  TECHNIQUE: Contiguous axial images were obtained from the base of the skull through the vertex without intravenous contrast.  COMPARISON:  Brain MRI 12/15/2004.  FINDINGS: Visualized paranasal sinuses and mastoids are clear. No acute orbit or scalp soft tissue findings. No acute osseous abnormality identified.  Calcified atherosclerosis at the skull base. Some generalized cerebral volume loss since 2006. No ventriculomegaly. No midline shift, mass effect, or evidence of intracranial mass lesion. Small area of chronic encephalomalacia in the left occipital pole is  stable. No acute intracranial hemorrhage identified.  Patchy cerebellar hypodensity, more so on the left (series 2, images 8 and 9). No associated mass effect. No suspicious intracranial vascular hyperdensity. No superimposed acute cortically based infarct identified.  IMPRESSION: 1. Patchy hypodensity in the cerebellum, more so the left, suspicious for subacute infarcts. Alternatively, this might be chronic but is new since 2006. 2. Stable small chronic infarct in the left occipital pole. 3. Generalized cerebral volume loss.   Electronically Signed   By: Augusto Gamble M.D.   On: November 23, 2013 13:14   Dg Chest Port 1 View  2013-11-23   CLINICAL DATA:  Fever  EXAM: PORTABLE CHEST - 1 VIEW  COMPARISON:  04/08/2009  FINDINGS: The heart size and mediastinal contours are within normal limits. Both lungs are clear. The visualized skeletal  structures are unremarkable.  IMPRESSION: No active disease.   Electronically Signed   By: Marlan Palau M.D.   On: 2013/11/23 11:34    CBC  Recent Labs Lab 2013-11-23 1116 11/23/13 1834 2013-11-23 2144 11/05/13 0540  WBC 24.4* 15.2* 16.5* 22.4*  HGB 10.9* 7.6* 8.7* 9.6*  HCT 31.5* 22.0* 25.0* 27.5*  PLT 265 184 204 229  MCV 88.7 88.4 88.0 87.6  MCH 30.7 30.5 30.6 30.6  MCHC 34.6 34.5 34.8 34.9  RDW 13.4 13.5 13.6 13.9  LYMPHSABS 1.0  --   --   --   MONOABS 0.7  --   --   --   EOSABS 0.0  --   --   --   BASOSABS 0.0  --   --   --     Chemistries   Recent Labs Lab 11-23-2013 1116 2013/11/23 1834 11/05/13 0540  NA 139  --  143  K 3.9  --  3.7  CL 101  --  108  CO2 18*  --  19  GLUCOSE 136*  --  181*  BUN 40*  --  46*  CREATININE 2.60* 1.96* 2.53*  CALCIUM 9.9  --  8.6  MG  --  1.5  --   AST 51*  --   --   ALT 34  --   --   ALKPHOS 144*  --   --   BILITOT 0.8  --   --    ------------------------------------------------------------------------------------------------------------------ estimated creatinine clearance is 15.5 ml/min (by C-G formula based on Cr of 2.53). ------------------------------------------------------------------------------------------------------------------  Recent Labs  2013/11/23 1834  HGBA1C 5.7*   ------------------------------------------------------------------------------------------------------------------  Recent Labs  11/05/13 0540  CHOL 102  HDL 13*  LDLCALC 60  TRIG 161  CHOLHDL 7.8   ------------------------------------------------------------------------------------------------------------------  Recent Labs  11-23-13 1834  TSH 1.198   ------------------------------------------------------------------------------------------------------------------ No results found for this basename: VITAMINB12, FOLATE, FERRITIN, TIBC, IRON, RETICCTPCT,  in the last 72 hours  Coagulation profile  Recent Labs Lab 2013/11/23 2144   INR 1.28    No results found for this basename: DDIMER,  in the last 72 hours  Cardiac Enzymes No results found for this basename: CK, CKMB, TROPONINI, MYOGLOBIN,  in the last 168 hours ------------------------------------------------------------------------------------------------------------------ No components found with this basename: POCBNP,     Jamea Robicheaux D.O. on 10/31/2013 at 9:29 AM  Between 7am to 7pm - Pager - [phone]  After 7pm go to [email or web address] - password [email or web address]  And look for the night coverage person covering for me after hours  [facility] Office  [phone]

## 2013-11-20 DEATH — deceased

## 2014-07-27 IMAGING — CR DG CHEST 1V PORT
1 series · 1 of 1 positions shown · non-contrast
Comparison: 04/08/2009

CLINICAL DATA: Fever

EXAM:
PORTABLE CHEST - 1 VIEW

[AP]
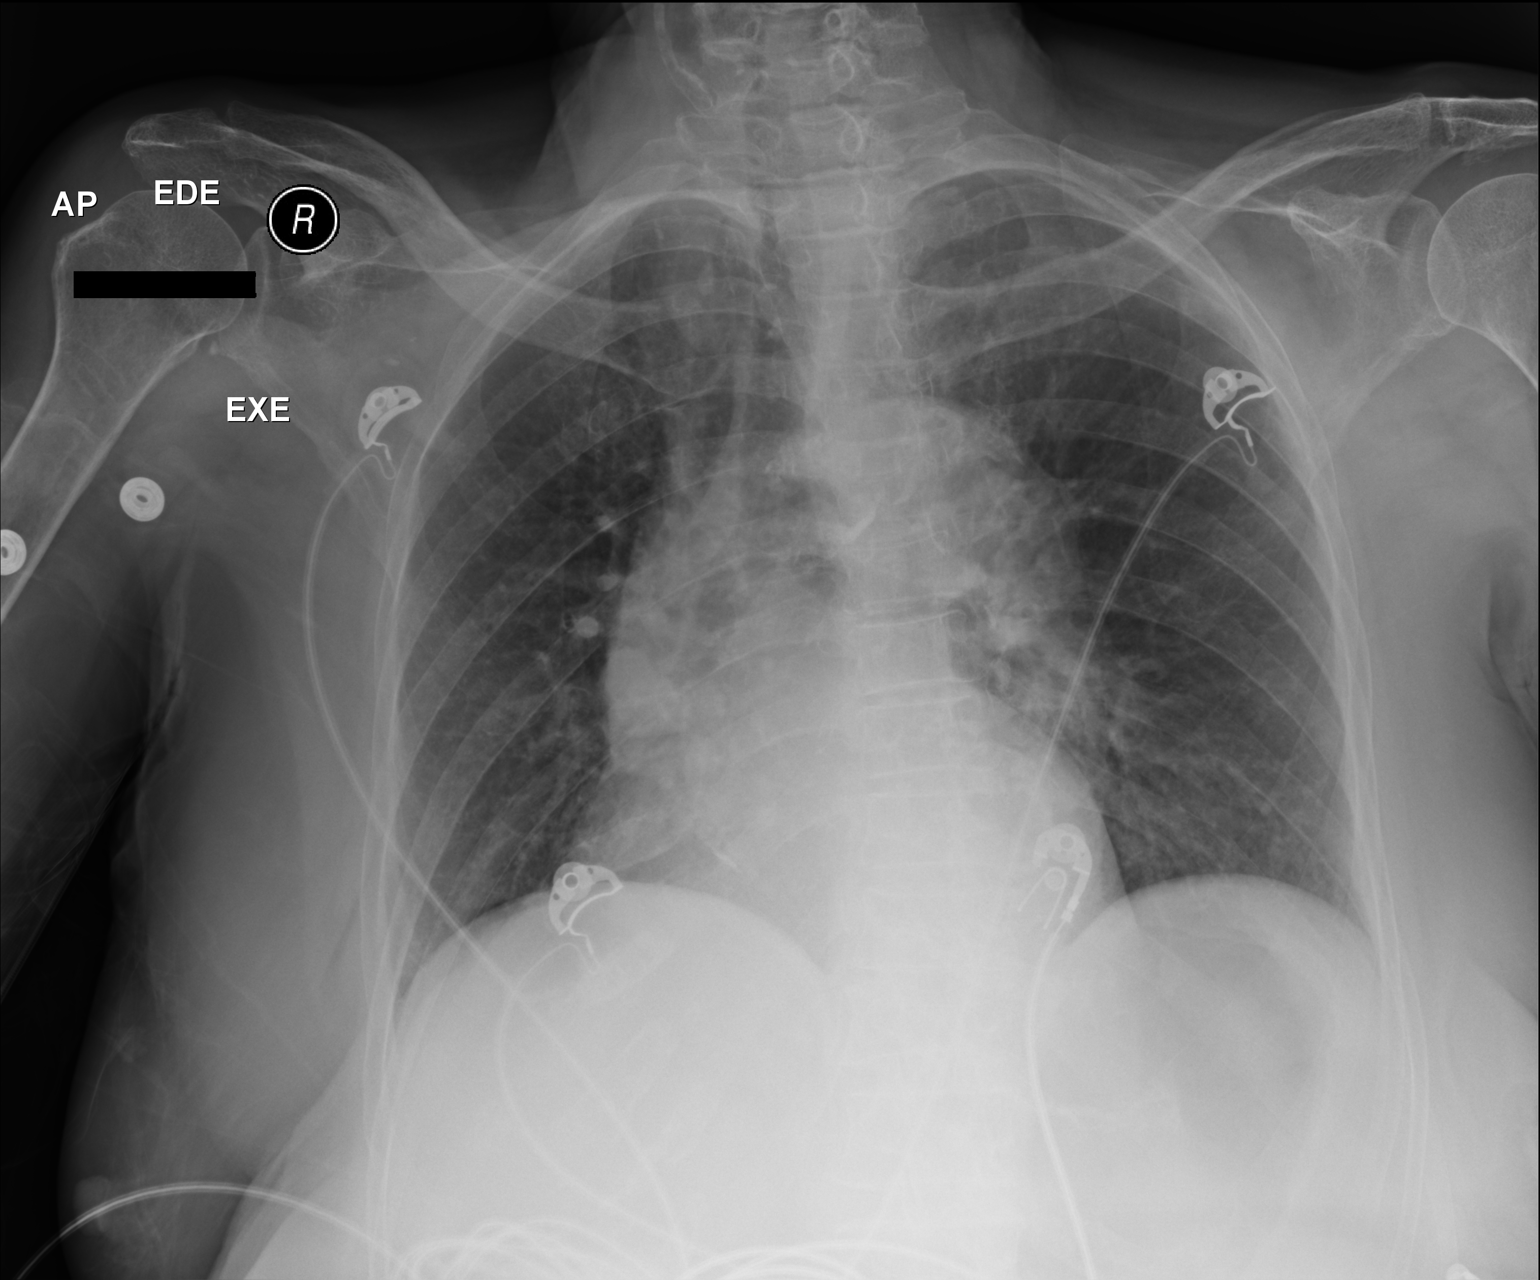

[1 of 1 positions shown; findings below may reference images not displayed]

FINDINGS: The heart size and mediastinal contours are within normal limits.
Both lungs are clear. The visualized skeletal structures are
unremarkable.
IMPRESSION: No active disease.

## 2014-07-27 IMAGING — CT CT HEAD W/O CM
2 series · 16 of 30 positions shown, 18 images · non-contrast
Comparison: Brain MRI 12/15/2004.

CLINICAL DATA: 88-year-old female with fever and lethargy. Initial
encounter.

EXAM:
CT HEAD WITHOUT CONTRAST
TECHNIQUE: Contiguous axial images were obtained from the base of the skull
through the vertex without intravenous contrast.

[Series 2: head w/o · axial · non-contrast · 0.49mm/px · z∈[+114,+234]mm · 8 of 32 slices shown, 10 images]
[im 4/32  brain]
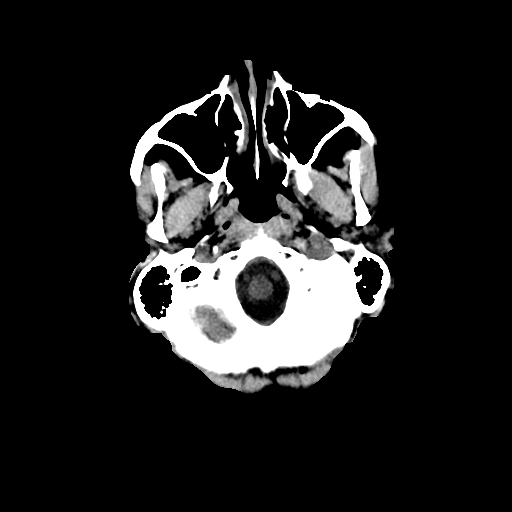
[im 4/32  bone]
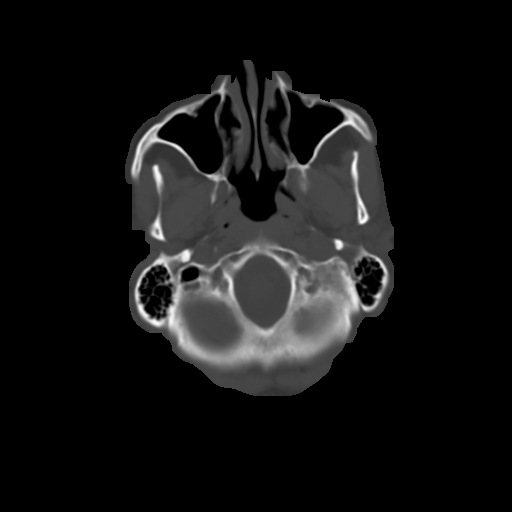
[im 7/32  brain]
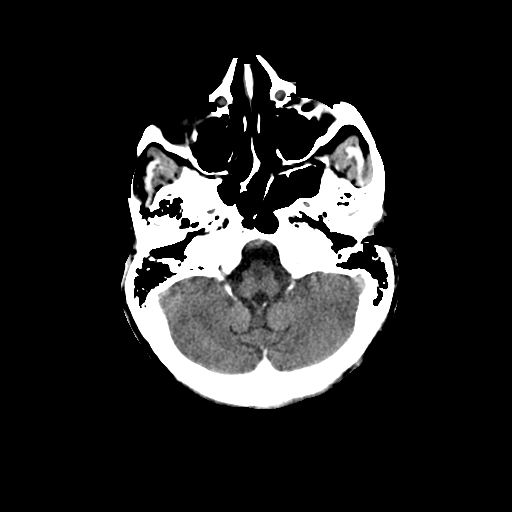
[im 11/32  brain]
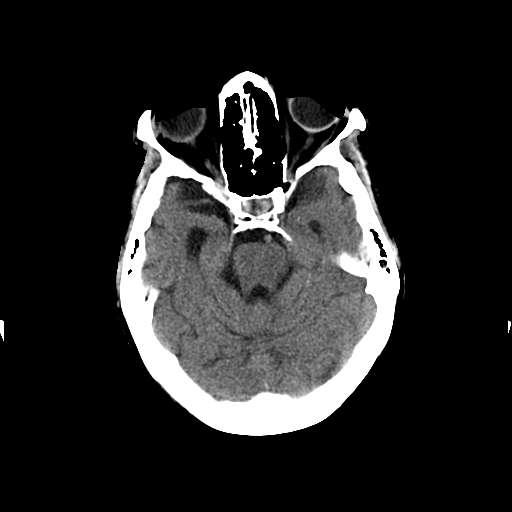
[im 14/32  brain]
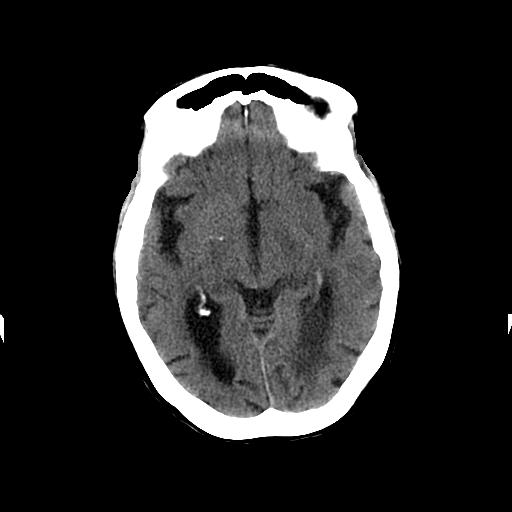
[im 18/32  brain]
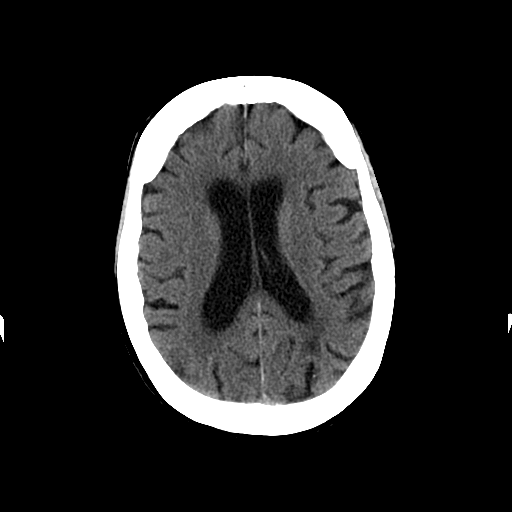
[im 18/32  bone]
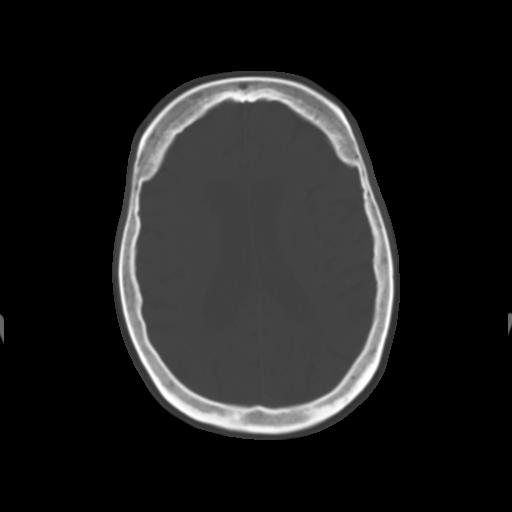
[im 21/32  brain]
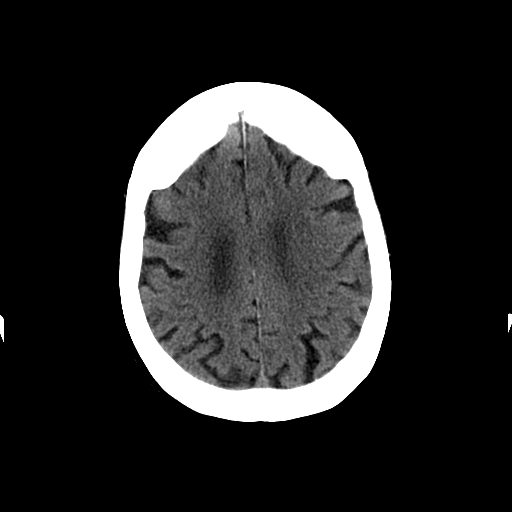
[im 25/32  brain]
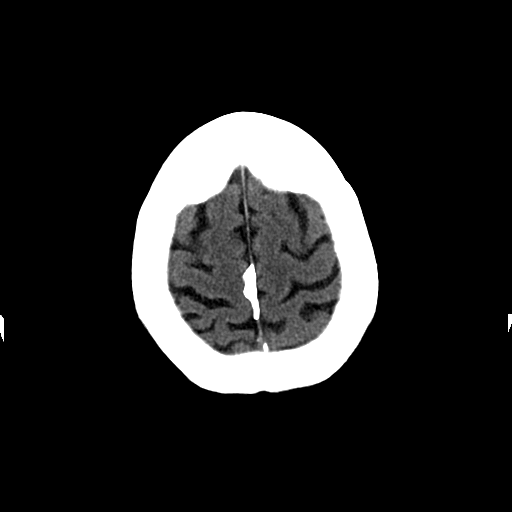
[im 28/32  brain]
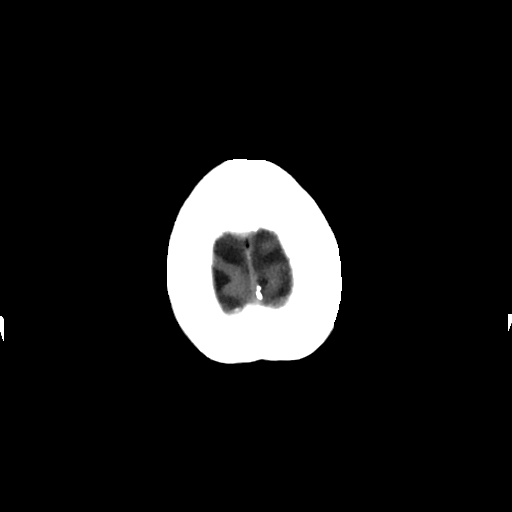

[Series 3: head w/o bone · axial · non-contrast · 0.49mm/px · z∈[+114,+236]mm · 8 of 63 slices shown]
[im 7/63  bone]
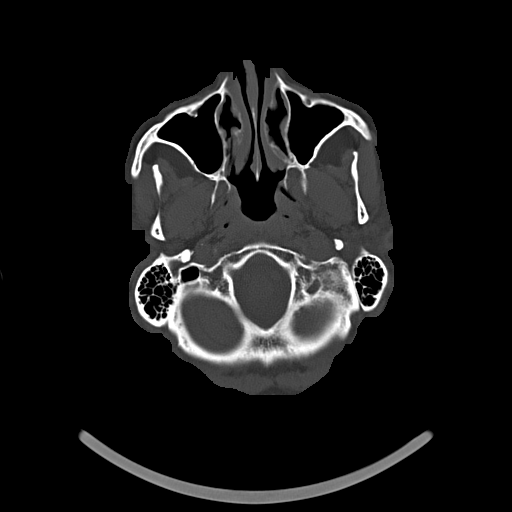
[im 14/63  bone]
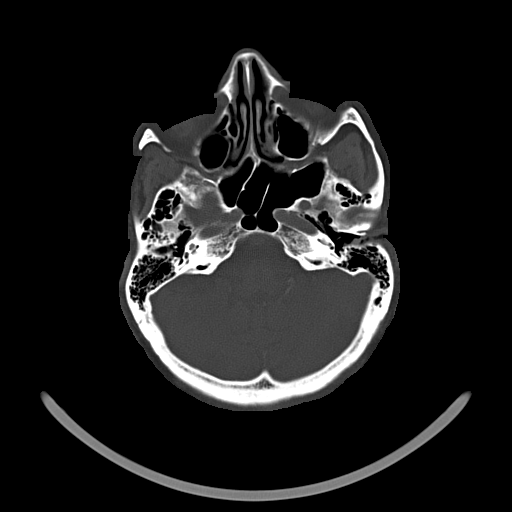
[im 20/63  bone]
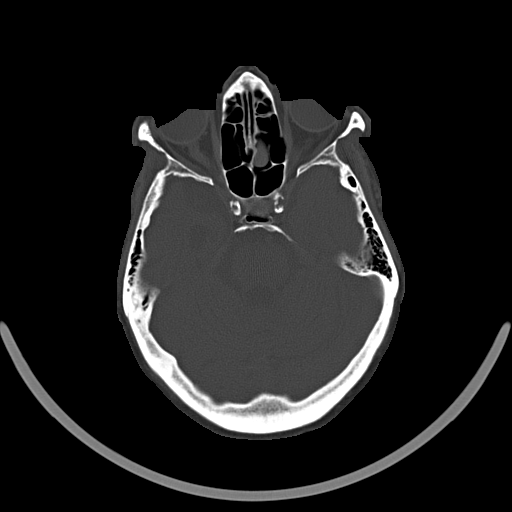
[im 27/63  bone]
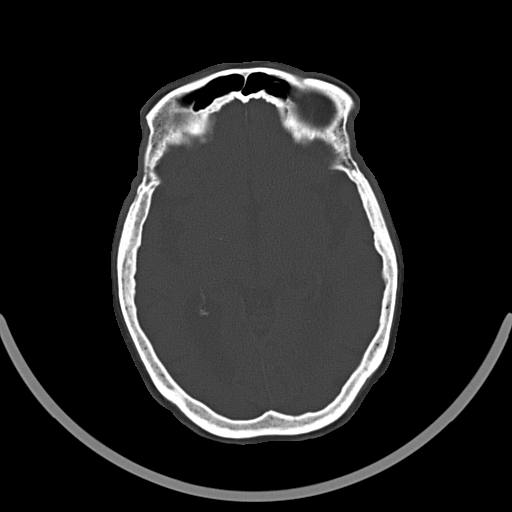
[im 36/63  bone]
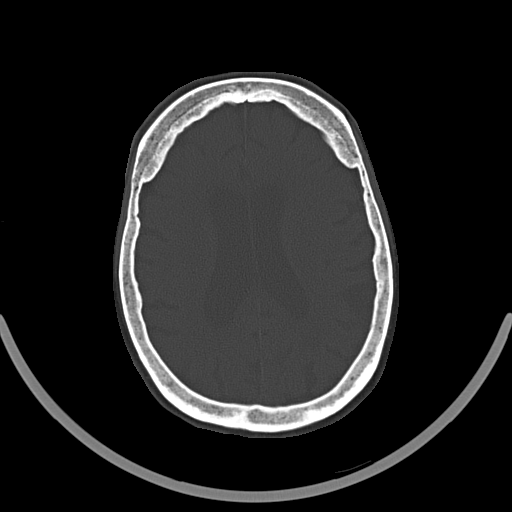
[im 43/63  bone]
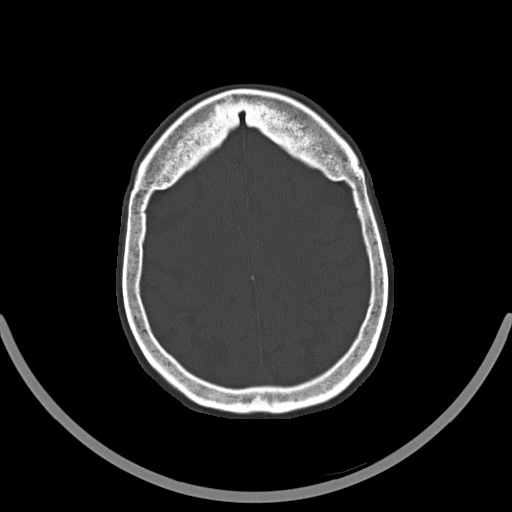
[im 49/63  bone]
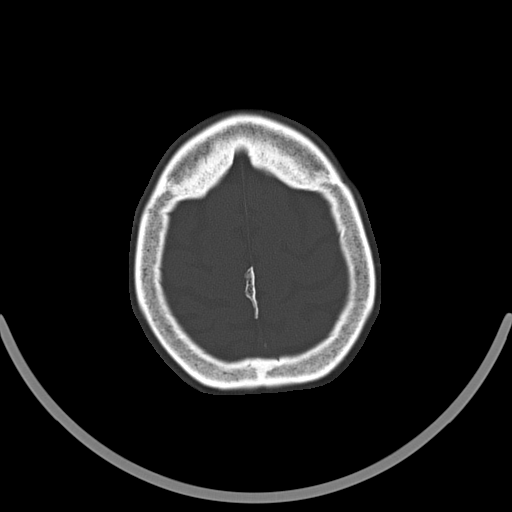
[im 56/63  bone]
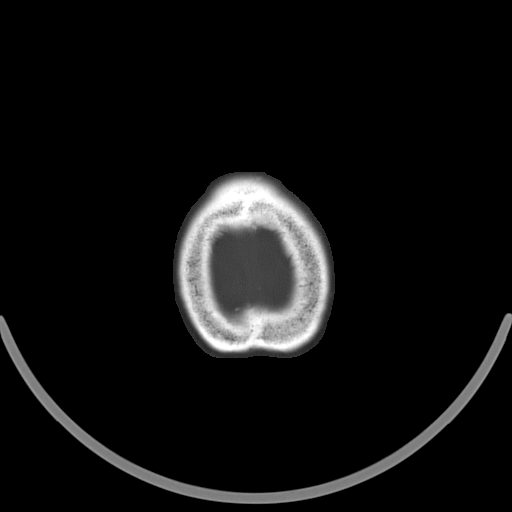

[16 of 30 positions shown; findings below may reference images not displayed]

FINDINGS: Visualized paranasal sinuses and mastoids are clear. No acute orbit
or scalp soft tissue findings. No acute osseous abnormality
identified.

Calcified atherosclerosis at the skull base. Some generalized
cerebral volume loss since 8228. No ventriculomegaly. No midline
shift, mass effect, or evidence of intracranial mass lesion. Small
area of chronic encephalomalacia in the left occipital pole is
stable. No acute intracranial hemorrhage identified.

Patchy cerebellar hypodensity, more so on the left (series 2, images
8 and 9). No associated mass effect. No suspicious intracranial
vascular hyperdensity. No superimposed acute cortically based
infarct identified.
IMPRESSION: 1. Patchy hypodensity in the cerebellum, more so the left,
suspicious for subacute infarcts. Alternatively, this might be
chronic but is new since [DATE]. Stable small chronic infarct in the left occipital pole.
3. Generalized cerebral volume loss.
